# Patient Record
Sex: Female | Born: 1935 | Race: Black or African American | Hispanic: No | Smoking: Never smoker
Health system: Southern US, Community
[De-identification: ages and names within clinical notes are randomized; demographics above are authoritative.]

## PROBLEM LIST (undated history)

## (undated) DIAGNOSIS — F419 Anxiety disorder, unspecified: Secondary | ICD-10-CM

## (undated) DIAGNOSIS — F0280 Dementia in other diseases classified elsewhere without behavioral disturbance: Secondary | ICD-10-CM

## (undated) DIAGNOSIS — G309 Alzheimer's disease, unspecified: Secondary | ICD-10-CM

## (undated) HISTORY — PX: ABDOMINAL HYSTERECTOMY: SHX81

---

## 2017-07-15 ENCOUNTER — Other Ambulatory Visit: Payer: Self-pay

## 2017-07-15 ENCOUNTER — Emergency Department: Payer: Medicare Other

## 2017-07-15 ENCOUNTER — Encounter: Payer: Self-pay | Admitting: Emergency Medicine

## 2017-07-15 ENCOUNTER — Inpatient Hospital Stay
Admission: EM | Admit: 2017-07-15 | Discharge: 2017-07-21 | DRG: 871 | Disposition: A | Discharge status: Home Health | Payer: Medicare Other | Attending: Internal Medicine | Admitting: Internal Medicine

## 2017-07-15 DIAGNOSIS — A419 Sepsis, unspecified organism: Principal | ICD-10-CM | POA: Diagnosis present

## 2017-07-15 DIAGNOSIS — N390 Urinary tract infection, site not specified: Secondary | ICD-10-CM | POA: Diagnosis not present

## 2017-07-15 DIAGNOSIS — B9620 Unspecified Escherichia coli [E. coli] as the cause of diseases classified elsewhere: Secondary | ICD-10-CM | POA: Diagnosis present

## 2017-07-15 DIAGNOSIS — F0390 Unspecified dementia without behavioral disturbance: Secondary | ICD-10-CM | POA: Diagnosis not present

## 2017-07-15 DIAGNOSIS — Z79899 Other long term (current) drug therapy: Secondary | ICD-10-CM

## 2017-07-15 DIAGNOSIS — R54 Age-related physical debility: Secondary | ICD-10-CM | POA: Diagnosis present

## 2017-07-15 DIAGNOSIS — Z6822 Body mass index (BMI) 22.0-22.9, adult: Secondary | ICD-10-CM | POA: Diagnosis not present

## 2017-07-15 DIAGNOSIS — R680 Hypothermia, not associated with low environmental temperature: Secondary | ICD-10-CM | POA: Diagnosis present

## 2017-07-15 DIAGNOSIS — I959 Hypotension, unspecified: Secondary | ICD-10-CM | POA: Diagnosis present

## 2017-07-15 DIAGNOSIS — R410 Disorientation, unspecified: Secondary | ICD-10-CM | POA: Diagnosis not present

## 2017-07-15 DIAGNOSIS — G9349 Other encephalopathy: Secondary | ICD-10-CM | POA: Diagnosis present

## 2017-07-15 DIAGNOSIS — R569 Unspecified convulsions: Secondary | ICD-10-CM | POA: Diagnosis present

## 2017-07-15 DIAGNOSIS — Z7189 Other specified counseling: Secondary | ICD-10-CM

## 2017-07-15 DIAGNOSIS — Z66 Do not resuscitate: Secondary | ICD-10-CM | POA: Diagnosis present

## 2017-07-15 DIAGNOSIS — Z515 Encounter for palliative care: Secondary | ICD-10-CM | POA: Diagnosis present

## 2017-07-15 DIAGNOSIS — E876 Hypokalemia: Secondary | ICD-10-CM | POA: Diagnosis not present

## 2017-07-15 DIAGNOSIS — D61818 Other pancytopenia: Secondary | ICD-10-CM | POA: Diagnosis not present

## 2017-07-15 DIAGNOSIS — R591 Generalized enlarged lymph nodes: Secondary | ICD-10-CM | POA: Diagnosis present

## 2017-07-15 DIAGNOSIS — E43 Unspecified severe protein-calorie malnutrition: Secondary | ICD-10-CM | POA: Diagnosis present

## 2017-07-15 DIAGNOSIS — N3001 Acute cystitis with hematuria: Secondary | ICD-10-CM | POA: Diagnosis present

## 2017-07-15 DIAGNOSIS — G309 Alzheimer's disease, unspecified: Secondary | ICD-10-CM | POA: Diagnosis present

## 2017-07-15 DIAGNOSIS — R4182 Altered mental status, unspecified: Secondary | ICD-10-CM | POA: Diagnosis not present

## 2017-07-15 DIAGNOSIS — F0280 Dementia in other diseases classified elsewhere without behavioral disturbance: Secondary | ICD-10-CM | POA: Diagnosis present

## 2017-07-15 HISTORY — DX: Dementia in other diseases classified elsewhere, unspecified severity, without behavioral disturbance, psychotic disturbance, mood disturbance, and anxiety: F02.80

## 2017-07-15 HISTORY — DX: Anxiety disorder, unspecified: F41.9

## 2017-07-15 HISTORY — DX: Alzheimer's disease, unspecified: G30.9

## 2017-07-15 LAB — URINALYSIS, COMPLETE (UACMP) WITH MICROSCOPIC
BILIRUBIN URINE: NEGATIVE
GLUCOSE, UA: NEGATIVE mg/dL
KETONES UR: NEGATIVE mg/dL
Nitrite: NEGATIVE
PROTEIN: 30 mg/dL — AB
Specific Gravity, Urine: 1.017 (ref 1.005–1.030)
pH: 5 (ref 5.0–8.0)

## 2017-07-15 LAB — CBC WITH DIFFERENTIAL/PLATELET
BASOS ABS: 0 10*3/uL (ref 0–0.1)
Basophils Relative: 0 %
EOS ABS: 0 10*3/uL (ref 0–0.7)
EOS PCT: 0 %
HCT: 35.7 % (ref 35.0–47.0)
Hemoglobin: 11.8 g/dL — ABNORMAL LOW (ref 12.0–16.0)
LYMPHS ABS: 0.4 10*3/uL — AB (ref 1.0–3.6)
Lymphocytes Relative: 12 %
MCH: 29.6 pg (ref 26.0–34.0)
MCHC: 33.1 g/dL (ref 32.0–36.0)
MCV: 89.4 fL (ref 80.0–100.0)
Monocytes Absolute: 0.2 10*3/uL (ref 0.2–0.9)
Monocytes Relative: 6 %
Neutro Abs: 2.7 10*3/uL (ref 1.4–6.5)
Neutrophils Relative %: 82 %
PLATELETS: 118 10*3/uL — AB (ref 150–440)
RBC: 4 MIL/uL (ref 3.80–5.20)
RDW: 16.9 % — ABNORMAL HIGH (ref 11.5–14.5)
WBC: 3.3 10*3/uL — AB (ref 3.6–11.0)

## 2017-07-15 LAB — COMPREHENSIVE METABOLIC PANEL
ALK PHOS: 58 U/L (ref 38–126)
ALT: 19 U/L (ref 14–54)
ANION GAP: 10 (ref 5–15)
AST: 34 U/L (ref 15–41)
Albumin: 2.9 g/dL — ABNORMAL LOW (ref 3.5–5.0)
BILIRUBIN TOTAL: 0.5 mg/dL (ref 0.3–1.2)
BUN: 39 mg/dL — ABNORMAL HIGH (ref 6–20)
CALCIUM: 9 mg/dL (ref 8.9–10.3)
CO2: 29 mmol/L (ref 22–32)
Chloride: 100 mmol/L — ABNORMAL LOW (ref 101–111)
Creatinine, Ser: 1.04 mg/dL — ABNORMAL HIGH (ref 0.44–1.00)
GFR calc non Af Amer: 46 mL/min — ABNORMAL LOW (ref >60)
GFR, EST AFRICAN AMERICAN: 53 mL/min — AB (ref >60)
Glucose, Bld: 121 mg/dL — ABNORMAL HIGH (ref 65–99)
Potassium: 3.7 mmol/L (ref 3.5–5.1)
Sodium: 139 mmol/L (ref 135–145)
TOTAL PROTEIN: 5.9 g/dL — AB (ref 6.5–8.1)

## 2017-07-15 LAB — LIPASE, BLOOD: Lipase: 16 U/L (ref 11–51)

## 2017-07-15 LAB — PROTIME-INR
INR: 1.15
Prothrombin Time: 14.6 seconds (ref 11.4–15.2)

## 2017-07-15 LAB — APTT: aPTT: 32 seconds (ref 24–36)

## 2017-07-15 LAB — PROCALCITONIN

## 2017-07-15 LAB — TROPONIN I

## 2017-07-15 LAB — LACTIC ACID, PLASMA
Lactic Acid, Venous: 2.5 mmol/L (ref 0.5–1.9)
Lactic Acid, Venous: 1.2 mmol/L (ref 0.5–1.9)

## 2017-07-15 MED ORDER — PIPERACILLIN-TAZOBACTAM 3.375 G IVPB
3.3750 g | Freq: Three times a day (TID) | INTRAVENOUS | Status: DC
  Administered 2017-07-16 – 2017-07-18 (×6): 3.375 g via INTRAVENOUS
  Filled 2017-07-15 (×6): qty 50

## 2017-07-15 MED ORDER — SODIUM CHLORIDE 0.9 % IV BOLUS (SEPSIS)
1000.0000 mL | Freq: Once | INTRAVENOUS | Status: AC
  Administered 2017-07-15: 1000 mL via INTRAVENOUS

## 2017-07-15 MED ORDER — VANCOMYCIN HCL IN DEXTROSE 750-5 MG/150ML-% IV SOLN
750.0000 mg | INTRAVENOUS | Status: DC
  Administered 2017-07-17: 750 mg via INTRAVENOUS
  Filled 2017-07-15: qty 150

## 2017-07-15 MED ORDER — PIPERACILLIN-TAZOBACTAM 3.375 G IVPB 30 MIN
3.3750 g | Freq: Once | INTRAVENOUS | Status: AC
  Administered 2017-07-15: 3.375 g via INTRAVENOUS
  Filled 2017-07-15: qty 50

## 2017-07-15 MED ORDER — VANCOMYCIN HCL IN DEXTROSE 1-5 GM/200ML-% IV SOLN
1000.0000 mg | Freq: Once | INTRAVENOUS | Status: AC
  Administered 2017-07-15: 1000 mg via INTRAVENOUS
  Filled 2017-07-15: qty 200

## 2017-07-15 MED ORDER — SODIUM CHLORIDE 0.9 % IV BOLUS (SEPSIS)
500.0000 mL | Freq: Once | INTRAVENOUS | Status: AC
  Administered 2017-07-15: 500 mL via INTRAVENOUS

## 2017-07-15 NOTE — H&P (Signed)
[facility] -  at [facility]   PATIENT NAME: Hayley Greene    MR#:  [id number]  DATE OF BIRTH:  04-Aug-1925  DATE OF ADMISSION:  07/15/2017  PRIMARY CARE PHYSICIAN: [facility]   REQUESTING/REFERRING PHYSICIAN: Scotty CourtStafford, MD  CHIEF COMPLAINT:   Chief Complaint  Patient presents with  . Altered Mental Status    HISTORY OF PRESENT ILLNESS:  Hayley LowersLuvenia Greene  is a 83 y.o. female who presents with increased confusion, hypothermia, possible seizure event.  Patient's granddaughter who is one of her caregivers states that tonight she began to have shaking episode, she was shaking all over and her eyes rolled back in her head, and she also had loss of bladder continence.  It is hard to say if the patient is postictal or not as she presents today septic from a urine infection.  Patient also has dementia and often times will have a 2-3-day period where she is more lethargic per the family's report.  Hospitals were initially called for admission, and after speaking with the family they [facility]  However, [facility] refused [facility], so hospitalist were asked to admit.  PAST MEDICAL HISTORY:   Past Medical History:  Diagnosis Date  . Alzheimer disease   . Anxiety     PAST SURGICAL HISTORY:   Past Surgical History:  Procedure Laterality Date  . ABDOMINAL HYSTERECTOMY      SOCIAL HISTORY:   Social History   Tobacco Use  . Smoking status: Never Smoker  Substance Use Topics  . Alcohol use: No    Frequency: Never    FAMILY HISTORY:   Family History  Problem Relation Age of Onset  . Stroke Brother     DRUG ALLERGIES:  No Known Allergies  MEDICATIONS AT HOME:   Prior to Admission medications   Medication Sig Start Date End Date Taking? Authorizing Provider  risperiDONE (RISPERDAL) 0.5 MG tablet Take 1 mg by mouth daily.   Yes [provider]    REVIEW OF SYSTEMS:  Review of Systems   Unable to perform ROS: Acuity of condition     VITAL SIGNS:   Vitals:   07/15/17 2230 07/15/17 2300 07/15/17 2327 07/15/17 2330  BP: (!) 98/53 (!) 96/51  (!) 92/49  Pulse: (!) 59 69  70  Resp: 16 16  15   Temp:   (!) 91 F (32.8 C)   TempSrc:      SpO2: 97% 95%  96%  Weight:      Height:       Wt Readings from Last 3 Encounters:  07/15/17 49.9 kg (110 lb)    PHYSICAL EXAMINATION:  Physical Exam  Vitals reviewed. Constitutional: She appears well-developed and well-nourished. No distress.  HENT:  Head: Normocephalic and atraumatic.  Mouth/Throat: Oropharynx is clear and moist.  Eyes: Conjunctivae and EOM are normal. Pupils are equal, round, and reactive to light. No scleral icterus.  Neck: Normal range of motion. Neck supple. No JVD present. No thyromegaly present.  Cardiovascular: Normal rate, regular rhythm and intact distal pulses. Exam reveals no gallop and no friction rub.  No murmur heard. Respiratory: Effort normal and breath sounds normal. No respiratory distress. She has no wheezes. She has no rales.  GI: Soft. Bowel sounds are normal. She exhibits no distension. There is no tenderness.  Musculoskeletal: Normal range of motion. She exhibits no edema.  No arthritis, no gout  Lymphadenopathy:    She has no  cervical adenopathy.  Neurological:  Unable to assess due to patient condition  Skin: Skin is warm and dry. No rash noted. No erythema.  Psychiatric:  Unable to assess due to patient condition    LABORATORY PANEL:   CBC Recent Labs  Lab 07/15/17 1948  WBC 3.3*  HGB 11.8*  HCT 35.7  PLT 118*   ------------------------------------------------------------------------------------------------------------------  Chemistries  Recent Labs  Lab 07/15/17 1948  NA 139  K 3.7  CL 100*  CO2 29  GLUCOSE 121*  BUN 39*  CREATININE 1.04*  CALCIUM 9.0  AST 34  ALT 19  ALKPHOS 58  BILITOT 0.5    ------------------------------------------------------------------------------------------------------------------  Cardiac Enzymes Recent Labs  Lab 07/15/17 1948  TROPONINI <0.03   ------------------------------------------------------------------------------------------------------------------  RADIOLOGY:  Ct Head Wo Contrast  Result Date: 07/15/2017 CLINICAL DATA:  83 year old female with altered mental status. EXAM: CT HEAD WITHOUT CONTRAST TECHNIQUE: Contiguous axial images were obtained from the base of the skull through the vertex without intravenous contrast. COMPARISON:  None. FINDINGS: Brain: No evidence of acute infarction, hemorrhage, hydrocephalus, extra-axial collection or mass lesion/mass effect. Moderate cerebral atrophy and chronic small-vessel white matter ischemic changes noted. Vascular: Atherosclerotic calcifications noted. Skull: Normal. Negative for fracture or focal lesion. Sinuses/Orbits: No acute finding. Other: None. IMPRESSION: 1. No evidence of acute intracranial abnormality 2. Atrophy and chronic small-vessel white matter ischemic changes. Electronically Signed   By: Harmon PierJeffrey  Hu M.D.   On: 07/15/2017 21:15   Dg Chest Port 1 View  Result Date: 07/15/2017 CLINICAL DATA:  Altered mental status and weakness today. EXAM: PORTABLE CHEST 1 VIEW COMPARISON:  None. FINDINGS: The cardiomediastinal silhouette is unremarkable. Mild left basilar atelectasis with possible trace left pleural effusion noted. There is no evidence of pneumothorax, airspace disease, edema or pulmonary mass. No acute bony abnormalities are identified. IMPRESSION: Mild left basilar atelectasis was possible trace left pleural effusion. Electronically Signed   By: Harmon PierJeffrey  Hu M.D.   On: 07/15/2017 21:13    EKG:   Orders placed or performed during the hospital encounter of 07/15/17  . EKG 12-Lead  . EKG 12-Lead  . EKG 12-Lead  . EKG 12-Lead    IMPRESSION AND PLAN:  Principal Problem:   Sepsis  (HCC) -IV antibiotics, lactic acid was initially elevated but corrected with fluids, blood pressure is borderline normotensive to low, continue IV fluids for blood pressure support, cultures sent Active Problems:   UTI (urinary tract infection) -because of sepsis as above, IV antibiotics, follow culture results   Dementia -continue home meds once patient is awake enough to take p.o. again  All the records are reviewed and case discussed with ED provider. Management plans discussed with the patient and/or family.  DVT PROPHYLAXIS: SubQ lovenox  GI PROPHYLAXIS: None  ADMISSION STATUS: Inpatient  CODE STATUS: Full, patient was unable to make this decision, this will need to be clarified with family. Code Status History    This patient does not have a recorded code status. Please follow your organizational policy for patients in this situation.      TOTAL TIME TAKING CARE OF THIS PATIENT: 45 minutes.   Mahala Rommel FIELDING 07/15/2017, 11:50 PM  [facility]  Office  [phone]  CC: Primary care physician; [facility]  Note:  This document was prepared using Conservation officer, historic buildingsDragon voice recognition software and may include unintentional dictation errors.

## 2017-07-15 NOTE — Progress Notes (Signed)
ANTIBIOTIC CONSULT NOTE - INITIAL  Pharmacy Consult for Zosyn and vancomycin Indication: sepsis  Allergies not on file  Patient Measurements: Height: 5' (152.4 cm) Weight: 110 lb (49.9 kg) IBW/kg (Calculated) : 45.5 Adjusted Body Weight:   Vital Signs: Temp: 88.6 F (31.4 C) (12/02 1959) Temp Source: Rectal (12/02 1959) BP: 109/66 (12/02 1959) Pulse Rate: 64 (12/02 1959) Intake/Output from previous day: No intake/output data recorded. Intake/Output from this shift: No intake/output data recorded.  Labs: Recent Labs    07/15/17 1948  WBC 3.3*  HGB 11.8*  PLT 118*  CREATININE 1.04*   Estimated Creatinine Clearance: 25.8 mL/min (A) (by C-G formula based on SCr of 1.04 mg/dL (H)). No results for input(s): VANCOTROUGH, VANCOPEAK, VANCORANDOM, GENTTROUGH, GENTPEAK, GENTRANDOM, TOBRATROUGH, TOBRAPEAK, TOBRARND, AMIKACINPEAK, AMIKACINTROU, AMIKACIN in the last 72 hours.   Microbiology: No results found for this or any previous visit (from the past 720 hour(s)).  Medical History: No past medical history on file.  Medications:  Infusions:  . piperacillin-tazobactam 3.375 g (07/15/17 2108)  . [START ON 07/16/2017] piperacillin-tazobactam (ZOSYN)  IV    . sodium chloride 1,000 mL (07/15/17 2108)   And  . sodium chloride    . vancomycin 1,000 mg (07/15/17 2109)  . [START ON 07/17/2017] vancomycin     Assessment: 89 yof cc AMS, not speaking or eating x 2 days per family. Hypothermic, hypotensive, sepsis protocol initiated. Pharmacy consulted to dose Zosyn and vancomycin for sepsis.   Goal of Therapy:  Vancomycin trough level 15-20 mcg/ml  Resolve infection Prevent ADE  Plan:  1. Zosyn 3.375 gm IV Q8H EI 2. Vancomycin 1 gm IV x 1 followed in approximately 30 hours (stacked dosing) by vancomycin 750 mg IV Q36H, predicted trough 16 mcg/mL. Pharmacy will continue to follow and adjust as needed to maintain trough 15 to 20 mcg/mL.   Vd 31.5 L, Ke 0.026 hr-1, T1/2 27.1  hr  Carola FrostNathan A Laylaa Guevarra, Pharm.D., BCPS Clinical Pharmacist 07/15/2017,9:22 PM

## 2017-07-15 NOTE — ED Notes (Addendum)
Patient returned to room, IVF and antibiotics started.  Patient placed back on warmer.  Family at bedside.

## 2017-07-15 NOTE — ED Notes (Signed)
Patient cool to touch, rectal tem 88.6, MD notified.  Warm blankets placed on patient until warmer can be placed.

## 2017-07-15 NOTE — ED Notes (Signed)
Patient to CT via stretcher.

## 2017-07-15 NOTE — ED Triage Notes (Signed)
Patient to ED via EMS from home of her granddaughter with whom she lives with only two days a week. [place] arrived from [place]other grand[place] on Tuesday and on Thursday became altered, not speaking or eating, and only a few sips of water. Patient is nonverbal on arrival to ED with a core temperature of 88.6 rectally,

## 2017-07-15 NOTE — Progress Notes (Signed)
CODE SEPSIS - PHARMACY COMMUNICATION  **Broad Spectrum Antibiotics should be administered within 1 hour of Sepsis diagnosis**  Time Code Sepsis Called/Page Received: 20:06  Antibiotics Ordered: Zosyn and vancomycin  Time of 1st antibiotic administration: 21:08  Additional action taken by pharmacy:   If necessary, Name of Provider/Nurse Contacted: Spoke with multiple RN, phone not with patient's nurse. Gave my call back number in rx [phone].  20:42 [facility] - she asks about compatibility (may be run together) - and says she'll take them out now.   21:00 spoke with Dawn again - 6 minutes left to administer antibiotic. She is aware and has abx hanging already, as soon as patient gets back from CT she just has to attach it.   Carola FrostNathan A Serenitie Vinton ,Pharm.D., BCPS Clinical Pharmacist  07/15/2017  8:20 PM

## 2017-07-15 NOTE — ED Notes (Signed)
Patient to [facility] via EMS from granddaughters home.  Per EMS they were called due to patient being unresponsive for family.  EMS report that family told them that she spends few days with granddaughter her in [place] and several days with granddaughter in [place].  EMS reports that patient was only responsive to painful stimuli.

## 2017-07-15 NOTE — ED Provider Notes (Signed)
[facility] Emergency Department Provider Note  ____________________________________________  Time seen: Approximately 8:22 PM  I have reviewed the triage vital signs and the nursing notes.   HISTORY  Chief Complaint Altered Mental Status  Level 5 Caveat: Portions of the History and Physical were unable to be obtained due to altered mental status due to chronic dementia and acute illness.   HPI Edana Aguado is a 83 y.o. female brought to the ED by EMS due to decreased level of consciousness, not speaking or eating for the past 2 days. No other history is available at this time. The patient lives with her 2 granddaughters, splitting time between their 2 residences. Today her granddaughter was concerned about the patient's health and called EMS.   Past Medical History:  Diagnosis Date  . Alzheimer disease    Dementia  There are no active problems to display for this patient.       Prior to Admission medications   Medication Sig Start Date End Date Taking? Authorizing Provider  risperiDONE (RISPERDAL) 0.5 MG tablet Take 1 mg by mouth daily.   Yes [provider]     Allergies Patient has no known allergies.   No family history on file.  Social History Social History   Tobacco Use  . Smoking status: Not on file  Substance Use Topics  . Alcohol use: Not on file  . Drug use: Not on file  The tobacco or alcohol use  Review of Systems Not obtainable due to altered mental status  ____________________________________________   PHYSICAL EXAM:  VITAL SIGNS: ED Triage Vitals  Enc Vitals Group     BP 07/15/17 1959 109/66     Pulse Rate 07/15/17 1959 64     Resp 07/15/17 1959 12     Temp 07/15/17 1959 (!) 88.6 F (31.4 C)     Temp Source 07/15/17 1959 Rectal     SpO2 07/15/17 1959 96 %     Weight 07/15/17 2001 110 lb (49.9 kg)     Height 07/15/17 2001 5' (1.524 m)     Head Circumference --      Peak Flow --      Pain Score  --      Pain Loc --      Pain Edu? --      Excl. in [place]? --     Vital signs reviewed, nursing assessments reviewed.   Constitutional:   Awake, not alert or oriented. Not in distress. Eyes:   No scleral icterus.  EOMI. No nystagmus. No conjunctival pallor. PERRL. ENT   Head:   Normocephalic and atraumatic.   Nose:   No congestion/rhinnorhea.    Mouth/Throat:   Dry mucous membranes, no pharyngeal erythema. No peritonsillar mass.    Neck:   No meningismus. Full ROM. Hematological/Lymphatic/Immunilogical:   No cervical lymphadenopathy. Cardiovascular:   RRR. Symmetric bilateral radial and DP pulses.  No murmurs.  Respiratory:   Normal respiratory effort without tachypnea/retractions. Breath sounds are clear and equal bilaterally. No wheezes/rales/rhonchi. Gastrointestinal:   Soft and nontender. Non distended. There is no CVA tenderness.  No rebound, rigidity, or guarding. Genitourinary:   deferred Musculoskeletal:   Normal range of motion in all extremities. No joint effusions.  No lower extremity tenderness.  No edema. Neurologic:   Nonverbal Unable to follow commands  Motor grossly intact..  Skin:    Skin is warm, dry and intact. No rash noted.  No petechiae, purpura, or bullae.  ____________________________________________    LABS (  pertinent positives/negatives) (all labs ordered are listed, but only abnormal results are displayed) Labs Reviewed  LACTIC ACID, PLASMA - Abnormal; Notable for the following components:      Result Value   Lactic Acid, Venous 2.5 (*)    All other components within normal limits  COMPREHENSIVE METABOLIC PANEL - Abnormal; Notable for the following components:   Chloride 100 (*)    Glucose, Bld 121 (*)    BUN 39 (*)    Creatinine, Ser 1.04 (*)    Total Protein 5.9 (*)    Albumin 2.9 (*)    GFR calc non Af Amer 46 (*)    GFR calc Af Amer 53 (*)    All other components within normal limits  CBC WITH DIFFERENTIAL/PLATELET - Abnormal;  Notable for the following components:   WBC 3.3 (*)    Hemoglobin 11.8 (*)    RDW 16.9 (*)    Platelets 118 (*)    Lymphs Abs 0.4 (*)    All other components within normal limits  URINALYSIS, COMPLETE (UACMP) WITH MICROSCOPIC - Abnormal; Notable for the following components:   Color, Urine AMBER (*)    APPearance CLOUDY (*)    Hgb urine dipstick SMALL (*)    Protein, ur 30 (*)    Leukocytes, UA LARGE (*)    Bacteria, UA MANY (*)    Squamous Epithelial / LPF 0-5 (*)    All other components within normal limits  CULTURE, BLOOD (ROUTINE X 2)  CULTURE, BLOOD (ROUTINE X 2)  URINE CULTURE  LACTIC ACID, PLASMA  LIPASE, BLOOD  TROPONIN I  PROCALCITONIN  APTT  PROTIME-INR   ____________________________________________   EKG  Interpreted by me Sinus rhythm rate of 62, normal axis and intervals. Normal QRS ST segments and T waves.  ____________________________________________    RADIOLOGY  Ct Head Wo Contrast  Result Date: 07/15/2017 CLINICAL DATA:  83 year old female with altered mental status. EXAM: CT HEAD WITHOUT CONTRAST TECHNIQUE: Contiguous axial images were obtained from the base of the skull through the vertex without intravenous contrast. COMPARISON:  None. FINDINGS: Brain: No evidence of acute infarction, hemorrhage, hydrocephalus, extra-axial collection or mass lesion/mass effect. Moderate cerebral atrophy and chronic small-vessel white matter ischemic changes noted. Vascular: Atherosclerotic calcifications noted. Skull: Normal. Negative for fracture or focal lesion. Sinuses/Orbits: No acute finding. Other: None. IMPRESSION: 1. No evidence of acute intracranial abnormality 2. Atrophy and chronic small-vessel white matter ischemic changes. Electronically Signed   By: Margarette Canada M.D.   On: 07/15/2017 21:15   Dg Chest Port 1 View  Result Date: 07/15/2017 CLINICAL DATA:  Altered mental status and weakness today. EXAM: PORTABLE CHEST 1 VIEW COMPARISON:  None. FINDINGS: The  cardiomediastinal silhouette is unremarkable. Mild left basilar atelectasis with possible trace left pleural effusion noted. There is no evidence of pneumothorax, airspace disease, edema or pulmonary mass. No acute bony abnormalities are identified. IMPRESSION: Mild left basilar atelectasis was possible trace left pleural effusion. Electronically Signed   By: Margarette Canada M.D.   On: 07/15/2017 21:13    ____________________________________________   PROCEDURES Procedures   Peripheral IV insertion by physician Indication: Multiple failed attempts by nursing staff, need for IV access and/or blood samples for workup Performed under continuous real-time ultrasound visualization Area cleaned with chlorhexidine. 20-gauge IV successfully placed in the left antecubital fossa. 2 attempts, no complications, EBL 0.   CRITICAL CARE Performed by: Joni Fears, Micheline Markes   Total critical care time: 35 minutes  Critical care time was exclusive of separately  billable procedures and treating other patients.  Critical care was necessary to treat or prevent imminent or life-threatening deterioration.  Critical care was time spent personally by me on the following activities: development of treatment plan with patient and/or surrogate as well as nursing, discussions with consultants, evaluation of patient's response to treatment, examination of patient, obtaining history from patient or surrogate, ordering and performing treatments and interventions, ordering and review of laboratory studies, ordering and review of radiographic studies, pulse oximetry and re-evaluation of patient's condition.  ____________________________________________   DIFFERENTIAL DIAGNOSIS  Stroke, intracranial hemorrhage, sepsis, dehydration  CLINICAL IMPRESSION / ASSESSMENT AND PLAN / ED COURSE  Pertinent labs & imaging results that were available during my care of the patient were reviewed by me and considered in my medical  decision making (see chart for details).   Patient presents with altered mental status, nonverbal and not following commands. Significantly hypothermic, raising concern for sepsis. Sepsis protocol initiated although there was a delay in actually activating the code sepsis protocol through care Link due to extended time being spent at the bedside upon initial assessment to rapidly obtain IV access to facilitate patient care.  I been notified that the family has arrived in the waiting room and are upset that they have not been able to come see the patient immediately while we clean up incontinent stool and obtain IV access and blood samples. We will bring them to the bedside as soon as feasible, once the patient's hygiene needs have been met and medical work up is in process.  Clinical Course as of Jul 15 2304  Nancy Fetter Jul 15, 2017  2203 CXR and CT head neg for pna, ptx, or ICH. Lactate elevated, continue IVF. I did discuss pt's sx and plan with family who were in agreement at bedside. Family do report that pt has had episodes of dehydration in the past and had to come to hospital for IVF, but hypothermia is a new issue.   [PS]  2253 Labs reveal UTI. Repeat lactate normalized. Will admit for further management of sepsis due to UTI.   [PS]    Clinical Course User Index [PS] Carrie Mew, MD     ____________________________________________   FINAL CLINICAL IMPRESSION(S) / ED DIAGNOSES    Final diagnoses:  Sepsis, due to unspecified organism [facility])  Lower urinary tract infectious disease  Confusion  Dementia without behavioral disturbance, unspecified dementia type      This SmartLink is deprecated. Use AVSMEDLIST instead to display the medication list for a patient.   Portions of this note were generated with dragon dictation software. Dictation errors may occur despite best attempts at proofreading.    Carrie Mew, MD 07/15/17 2306

## 2017-07-16 ENCOUNTER — Other Ambulatory Visit: Payer: Self-pay

## 2017-07-16 ENCOUNTER — Inpatient Hospital Stay (HOSPITAL_COMMUNITY): Payer: Medicare Other

## 2017-07-16 ENCOUNTER — Inpatient Hospital Stay: Payer: Medicare Other

## 2017-07-16 DIAGNOSIS — R4182 Altered mental status, unspecified: Secondary | ICD-10-CM

## 2017-07-16 DIAGNOSIS — R569 Unspecified convulsions: Secondary | ICD-10-CM

## 2017-07-16 LAB — CBC
HCT: 29.9 % — ABNORMAL LOW (ref 35.0–47.0)
Hemoglobin: 9.9 g/dL — ABNORMAL LOW (ref 12.0–16.0)
MCH: 29.7 pg (ref 26.0–34.0)
MCHC: 33.3 g/dL (ref 32.0–36.0)
MCV: 89.3 fL (ref 80.0–100.0)
PLATELETS: 110 10*3/uL — AB (ref 150–440)
RBC: 3.34 MIL/uL — AB (ref 3.80–5.20)
RDW: 16.7 % — AB (ref 11.5–14.5)
WBC: 4.9 10*3/uL (ref 3.6–11.0)

## 2017-07-16 LAB — BLOOD CULTURE ID PANEL (REFLEXED)
Acinetobacter baumannii: NOT DETECTED
CANDIDA KRUSEI: NOT DETECTED
CANDIDA PARAPSILOSIS: NOT DETECTED
CANDIDA TROPICALIS: NOT DETECTED
Candida albicans: NOT DETECTED
Candida glabrata: NOT DETECTED
ESCHERICHIA COLI: NOT DETECTED
Enterobacter cloacae complex: NOT DETECTED
Enterobacteriaceae species: NOT DETECTED
Enterococcus species: NOT DETECTED
Haemophilus influenzae: NOT DETECTED
KLEBSIELLA OXYTOCA: NOT DETECTED
KLEBSIELLA PNEUMONIAE: NOT DETECTED
Listeria monocytogenes: NOT DETECTED
Neisseria meningitidis: NOT DETECTED
PROTEUS SPECIES: NOT DETECTED
Pseudomonas aeruginosa: NOT DETECTED
STAPHYLOCOCCUS AUREUS BCID: NOT DETECTED
STAPHYLOCOCCUS SPECIES: NOT DETECTED
Serratia marcescens: NOT DETECTED
Streptococcus agalactiae: NOT DETECTED
Streptococcus pneumoniae: NOT DETECTED
Streptococcus pyogenes: NOT DETECTED
Streptococcus species: NOT DETECTED

## 2017-07-16 LAB — BASIC METABOLIC PANEL
ANION GAP: 8 (ref 5–15)
BUN: 38 mg/dL — ABNORMAL HIGH (ref 6–20)
CO2: 24 mmol/L (ref 22–32)
Calcium: 7.9 mg/dL — ABNORMAL LOW (ref 8.9–10.3)
Chloride: 107 mmol/L (ref 101–111)
Creatinine, Ser: 1.13 mg/dL — ABNORMAL HIGH (ref 0.44–1.00)
GFR, EST AFRICAN AMERICAN: 48 mL/min — AB (ref >60)
GFR, EST NON AFRICAN AMERICAN: 41 mL/min — AB (ref >60)
Glucose, Bld: 79 mg/dL (ref 65–99)
POTASSIUM: 4 mmol/L (ref 3.5–5.1)
SODIUM: 139 mmol/L (ref 135–145)

## 2017-07-16 MED ORDER — HEPARIN SODIUM (PORCINE) 5000 UNIT/ML IJ SOLN
5000.0000 [IU] | Freq: Three times a day (TID) | INTRAMUSCULAR | Status: DC
  Administered 2017-07-16 – 2017-07-21 (×16): 5000 [IU] via SUBCUTANEOUS
  Filled 2017-07-16 (×16): qty 1

## 2017-07-16 MED ORDER — SODIUM CHLORIDE 0.9 % IV SOLN
INTRAVENOUS | Status: AC
  Administered 2017-07-16: 02:00:00 via INTRAVENOUS

## 2017-07-16 MED ORDER — ORAL CARE MOUTH RINSE
15.0000 mL | Freq: Two times a day (BID) | OROMUCOSAL | Status: DC
  Administered 2017-07-17 – 2017-07-20 (×6): 15 mL via OROMUCOSAL

## 2017-07-16 MED ORDER — ENOXAPARIN SODIUM 30 MG/0.3ML ~~LOC~~ SOLN: 30.0000 mg | SUBCUTANEOUS | Status: DC

## 2017-07-16 MED ORDER — ACETAMINOPHEN 325 MG PO TABS: 650.0000 mg | ORAL_TABLET | Freq: Four times a day (QID) | ORAL | Status: DC | PRN

## 2017-07-16 MED ORDER — RISPERIDONE 1 MG PO TABS
1.0000 mg | ORAL_TABLET | Freq: Every day | ORAL | Status: DC
  Administered 2017-07-19 – 2017-07-21 (×3): 1 mg via ORAL
  Filled 2017-07-16 (×6): qty 1

## 2017-07-16 MED ORDER — ONDANSETRON HCL 4 MG PO TABS: 4.0000 mg | ORAL_TABLET | Freq: Four times a day (QID) | ORAL | Status: DC | PRN

## 2017-07-16 MED ORDER — ENOXAPARIN SODIUM 40 MG/0.4ML ~~LOC~~ SOLN: 40.0000 mg | SUBCUTANEOUS | Status: DC

## 2017-07-16 MED ORDER — SODIUM CHLORIDE 0.9 % IV BOLUS (SEPSIS): 1000.0000 mL | Freq: Once | INTRAVENOUS | Status: DC

## 2017-07-16 MED ORDER — ONDANSETRON HCL 4 MG/2ML IJ SOLN: 4.0000 mg | Freq: Four times a day (QID) | INTRAMUSCULAR | Status: DC | PRN

## 2017-07-16 MED ORDER — ACETAMINOPHEN 650 MG RE SUPP: 650.0000 mg | Freq: Four times a day (QID) | RECTAL | Status: DC | PRN

## 2017-07-16 MED ORDER — ASPIRIN 300 MG RE SUPP
300.0000 mg | Freq: Every day | RECTAL | Status: DC
  Administered 2017-07-16 – 2017-07-18 (×3): 300 mg via RECTAL
  Filled 2017-07-16 (×3): qty 1

## 2017-07-16 MED ORDER — SODIUM CHLORIDE 0.9 % IV SOLN
INTRAVENOUS | Status: DC
  Administered 2017-07-16 – 2017-07-19 (×5): via INTRAVENOUS

## 2017-07-16 NOTE — Progress Notes (Signed)
PHARMACY - PHYSICIAN COMMUNICATION CRITICAL VALUE ALERT - BLOOD CULTURE IDENTIFICATION (BCID)  Results for orders placed or performed during the hospital encounter of 07/15/17  Blood Culture ID Panel (Reflexed) (Collected: 07/15/2017  8:39 PM)  Result Value Ref Range   Enterococcus species NOT DETECTED NOT DETECTED   Listeria monocytogenes NOT DETECTED NOT DETECTED   Staphylococcus species NOT DETECTED NOT DETECTED   Staphylococcus aureus NOT DETECTED NOT DETECTED   Streptococcus species NOT DETECTED NOT DETECTED   Streptococcus agalactiae NOT DETECTED NOT DETECTED   Streptococcus pneumoniae NOT DETECTED NOT DETECTED   Streptococcus pyogenes NOT DETECTED NOT DETECTED   Acinetobacter baumannii NOT DETECTED NOT DETECTED   Enterobacteriaceae species NOT DETECTED NOT DETECTED   Enterobacter cloacae complex NOT DETECTED NOT DETECTED   Escherichia coli NOT DETECTED NOT DETECTED   Klebsiella oxytoca NOT DETECTED NOT DETECTED   Klebsiella pneumoniae NOT DETECTED NOT DETECTED   Proteus species NOT DETECTED NOT DETECTED   Serratia marcescens NOT DETECTED NOT DETECTED   Haemophilus influenzae NOT DETECTED NOT DETECTED   Neisseria meningitidis NOT DETECTED NOT DETECTED   Pseudomonas aeruginosa NOT DETECTED NOT DETECTED   Candida albicans NOT DETECTED NOT DETECTED   Candida glabrata NOT DETECTED NOT DETECTED   Candida krusei NOT DETECTED NOT DETECTED   Candida parapsilosis NOT DETECTED NOT DETECTED   Candida tropicalis NOT DETECTED NOT DETECTED    Name of physician (or Provider) Contacted: Dr. Auburn BilberryShreyang Patel  Changes to prescribed antibiotics required: patient is on vancomycin and Zosyn with no ID per BCID- continue current for now  Valentina GuChristy, Imaad Reuss D 07/16/2017  8:26 PM

## 2017-07-16 NOTE — ED Notes (Signed)
Patient is a difficulty IV stick, MD attempted X 3 with ultrasound to obtain 1 IV site.

## 2017-07-16 NOTE — ED Provider Notes (Signed)
-----------------------------------------   12:27 AM on 07/16/2017 -----------------------------------------  Dr. Anne HahnWillis evaluated patient at bedside and informs me patient's family at bedside who now [facility] [place] and family lives nearby.  Her granddaughter who is at bedside also has POA and confirms that patient is a full code.  I have spoken with the [facility] [place] who will page out the physician.   ----------------------------------------- 12:51 AM on 07/16/2017 -----------------------------------------  Spoke with hospitalist Dr. Alfredo BachAvila who refused patient in transfer. States there is no medical necessity to transfer her. I did explain to him that the family is requesting transfer given the patient's PCP is located there and she has been hospitalized there before. Dr. Alfredo BachAvila did not accept. Will call back to Dr. Anne HahnWillis from hospitalist services for admission.   Irean HongSung, Rhett Mutschler J, MD 07/16/17 [phone]

## 2017-07-16 NOTE — Progress Notes (Signed)
Dr Sheryle Hailiamond made aware of lack of access.  Order given to consult IV team for access.

## 2017-07-16 NOTE — ED Notes (Signed)
Admitting physician in with patient and family

## 2017-07-16 NOTE — Progress Notes (Signed)
[employer] - [place] [facility]   PATIENT NAME: Hayley LowersLuvenia Greene    MR#:  [id number]  DATE OF BIRTH:  02/06/1925  SUBJECTIVE:   Patient here due to altered mental status and suspected seizure type activity. Patient's granddaughter is at bedside. Patient apparently had a seizure-like episode yesterday and she was incontinent of urine. She is on a post ictal and nonverbal. She responds minimally to painful stimuli. She was also hypothermic and noted to have urinary tract infection.  REVIEW OF SYSTEMS:    Review of Systems  Unable to perform ROS: Mental acuity    Nutrition: NPO Tolerating Diet: No Tolerating PT: Await Eval.   DRUG ALLERGIES:  No Known Allergies  VITALS:  Blood pressure (!) 94/44, pulse 82, temperature 97.6 F (36.4 C), temperature source Oral, resp. rate 19, height 5' (1.524 m), weight 51.1 kg (112 lb 11.2 oz), SpO2 95 %.  PHYSICAL EXAMINATION:   Physical Exam  GENERAL:  83 y.o.-year-old patient lying in bed encephalopathic but in NAD.  EYES: Pupils equal, round, reactive to light. No scleral icterus.  HEENT: Head atraumatic, normocephalic. Oropharynx and nasopharynx clear. Dry Oral mucosa.  NECK:  Supple, no jugular venous distention. No thyroid enlargement, no tenderness.  LUNGS: Normal breath sounds bilaterally, no wheezing, rales, rhonchi. No use of accessory muscles of respiration.  CARDIOVASCULAR: S1, S2 normal. No murmurs, rubs, or gallops.  ABDOMEN: Soft, nontender, nondistended. Bowel sounds present. No organomegaly or mass.  EXTREMITIES: No cyanosis, clubbing or edema b/l.    NEUROLOGIC: Difficult to assess a full neurological exam given the patient's encephalopathy the patient responds to painful stimuli. PSYCHIATRIC: The patient is alert and oriented x 1. Encephalopathic  SKIN: No obvious rash, lesion, or ulcer.    LABORATORY PANEL:   CBC Recent Labs  Lab 07/16/17 0605  WBC 4.9  HGB 9.9*  HCT 29.9*  PLT 110*    ------------------------------------------------------------------------------------------------------------------  Chemistries  Recent Labs  Lab 07/15/17 1948 07/16/17 0605  NA 139 139  K 3.7 4.0  CL 100* 107  CO2 29 24  GLUCOSE 121* 79  BUN 39* 38*  CREATININE 1.04* 1.13*  CALCIUM 9.0 7.9*  AST 34  --   ALT 19  --   ALKPHOS 58  --   BILITOT 0.5  --    ------------------------------------------------------------------------------------------------------------------  Cardiac Enzymes Recent Labs  Lab 07/15/17 1948  TROPONINI <0.03   ------------------------------------------------------------------------------------------------------------------  RADIOLOGY:  Ct Head Wo Contrast  Result Date: 07/15/2017 CLINICAL DATA:  83 year old female with altered mental status. EXAM: CT HEAD WITHOUT CONTRAST TECHNIQUE: Contiguous axial images were obtained from the base of the skull through the vertex without intravenous contrast. COMPARISON:  None. FINDINGS: Brain: No evidence of acute infarction, hemorrhage, hydrocephalus, extra-axial collection or mass lesion/mass effect. Moderate cerebral atrophy and chronic small-vessel white matter ischemic changes noted. Vascular: Atherosclerotic calcifications noted. Skull: Normal. Negative for fracture or focal lesion. Sinuses/Orbits: No acute finding. Other: None. IMPRESSION: 1. No evidence of acute intracranial abnormality 2. Atrophy and chronic small-vessel white matter ischemic changes. Electronically Signed   By: Harmon PierJeffrey  Hu M.D.   On: 07/15/2017 21:15   Dg Chest Port 1 View  Result Date: 07/15/2017 CLINICAL DATA:  Altered mental status and weakness today. EXAM: PORTABLE CHEST 1 VIEW COMPARISON:  None. FINDINGS: The cardiomediastinal silhouette is unremarkable. Mild left basilar atelectasis with possible trace left pleural effusion noted. There is no evidence of pneumothorax, airspace disease, edema or pulmonary mass. No acute bony  abnormalities are identified. IMPRESSION: Mild  left basilar atelectasis was possible trace left pleural effusion. Electronically Signed   By: Harmon PierJeffrey  Hu M.D.   On: 07/15/2017 21:13     ASSESSMENT AND PLAN:   83 year old female with advanced dementia was followed by hospice services at home presents to the hospital due to altered mental status and suspected to have a seizure.  1. Altered mental status-etiology unclear and suspected to be a multifactorial source. -Secondary to underlying dementia combined with suspected seizure with postictal state, and also urinary tract infection. -CT head on admission was negative for acute pathology. Continue IV antibiotics with Zosyn for urinary tract infection. Follow mental status. -Seen by neurology and plan for MRI of the brain today.  2. Suspected seizures-patient noted to have seizure type activity and then developed postictal state and was incontinent of urine as per the granddaughter yesterday. -Appreciate nephrology input no acute indication for antiepileptic treatment at this time. -Continue seizure precautions, await MRI of the brain.  3. Urinary tract infection-continue IV Zosyn, follow urine cultures.  4. Sepsis-patient with criteria given her hypothermia, hypotension also abnormal urinalysis. -Continue IV Zosyn, follow cultures as mentioned. Currently afebrile and hemodynamically stable.   Given patient's advanced dementia she probably has a very poor prognosis. Updated patient's granddaughters over the phone about the plan of care. They seem somewhat unrealistic. I will get a palliative care consult to discuss goals of care.  All the records are reviewed and case discussed with Care Management/Social Worker. Management plans discussed with the patient, family and they are in agreement.  CODE STATUS: Full code  DVT Prophylaxis: Lovenox  TOTAL TIME TAKING CARE OF THIS PATIENT: 35 minutes.   POSSIBLE D/C IN 2-3 DAYS, DEPENDING ON  CLINICAL CONDITION.   Houston SirenSAINANI,Woodrow Dulski J M.D on 07/16/2017 at 3:12 PM  Between 7am to 6pm - Pager - [phone]  After 6pm go to [email or web address] - Therapist, nutritionalpassword EPAS [facility]  [employer] [facility] [place] [facility]  Office  [phone]  CC: Primary care physician; [facility]

## 2017-07-16 NOTE — Consult Note (Signed)
Reason for Consult:Seizure like activity Referring Physician: Cherlynn KaiserSainani  CC: Seizure like activity  HPI: Hayley Greene is an 83 y.o. female with a history of dementia who is unable to provide ant history today.  Family not available.  Therefore all history obtained from the chart.  Patient was brought in by her granddaughter on yesterday.  It appears that the patient had an episode of shaking all over.  Her eyes rolled back in her head.  She also had urinary incontinence.  When she presented she was lethargic but it does appear that the patient has a history of having 2-3-day.  When she is more lethargic than other days.  It also seem to the patient had a urinary tract infection as well.  Patient has remained poorly responsive since presentation.  Consult called for further recommendations.  Past Medical History:  Diagnosis Date  . Alzheimer disease   . Anxiety     Past Surgical History:  Procedure Laterality Date  . ABDOMINAL HYSTERECTOMY      Family History  Problem Relation Age of Onset  . Stroke Brother     Social History:  reports that  has never smoked. she has never used smokeless tobacco. She reports that she does not drink alcohol or use drugs.  No Known Allergies  Medications:  I have reviewed the patient's current medications. Prior to Admission:  Medications Prior to Admission  Medication Sig Dispense Refill Last Dose  . risperiDONE (RISPERDAL) 0.5 MG tablet Take 1 mg by mouth daily.      Scheduled: . enoxaparin (LOVENOX) injection  30 mg Subcutaneous Q24H  . mouth rinse  15 mL Mouth Rinse BID  . risperiDONE  1 mg Oral Daily    ROS: Unable to provide due to mental status.  Physical Examination: Blood pressure (!) 86/41, pulse 92, temperature 97.7 F (36.5 C), temperature source Oral, resp. rate 19, height 5' (1.524 m), weight 51.1 kg (112 lb 11.2 oz), SpO2 96 %.  HEENT-  Normocephalic, no lesions, without obvious abnormality.  Normal external eye and  conjunctiva.  Normal TM's bilaterally.  Normal auditory canals and external ears. Normal external nose, mucus membranes and septum.  Normal pharynx. Cardiovascular- S1, S2 normal, pulses palpable throughout   Lungs- chest clear, no wheezing, rales, normal symmetric air entry Abdomen- soft, non-tender; bowel sounds normal; no masses,  no organomegaly Extremities- no edema Lymph-no adenopathy palpable Musculoskeletal-no joint tenderness, deformity or swelling Skin-warm and dry, no hyperpigmentation, vitiligo, or suspicious lesions  Neurological Examination   Mental Status: Patient does not respond to verbal stimuli.  Grimaces with deep sternal rub.  Does not follow commands.  No verbalizations noted.  Cranial Nerves: II: patient does not respond confrontation bilaterally, pupils right 3 mm, left 3 mm,and reactive bilaterally III,IV,VI: doll's response present bilaterally.  V,VII: corneal reflex present bilaterally  VIII: patient does not respond to verbal stimuli IX,X: gag reflex reduced, XI: trapezius strength unable to test bilaterally XII: tongue strength unable to test Motor: With sternal rub patient moves the left more than the right.  Localizes to pain with the LUE.   Sensory: Does not respond to noxious stimuli in any extremity. Deep Tendon Reflexes:  1+ in the upper extremities and absent in the lower extremities Plantars: mute bilaterally Cerebellar: Unable to perform    Laboratory Studies:   Basic Metabolic Panel: Recent Labs  Lab 07/15/17 1948 07/16/17 0605  NA 139 139  K 3.7 4.0  CL 100* 107  CO2 29 24  GLUCOSE  121* 79  BUN 39* 38*  CREATININE 1.04* 1.13*  CALCIUM 9.0 7.9*    Liver Function Tests: Recent Labs  Lab 07/15/17 1948  AST 34  ALT 19  ALKPHOS 58  BILITOT 0.5  PROT 5.9*  ALBUMIN 2.9*   Recent Labs  Lab 07/15/17 1948  LIPASE 16   No results for input(s): AMMONIA in the last 168 hours.  CBC: Recent Labs  Lab 07/15/17 1948  07/16/17 0605  WBC 3.3* 4.9  NEUTROABS 2.7  --   HGB 11.8* 9.9*  HCT 35.7 29.9*  MCV 89.4 89.3  PLT 118* 110*    Cardiac Enzymes: Recent Labs  Lab 07/15/17 1948  TROPONINI <0.03    BNP: Invalid input(s): POCBNP  CBG: No results for input(s): GLUCAP in the last 168 hours.  Microbiology: Results for orders placed or performed during the hospital encounter of 07/15/17  Blood Culture (routine x 2)     Status: None (Preliminary result)   Collection Time: 07/15/17  7:48 PM  Result Value Ref Range Status   Specimen Description BLOOD LEFT UPPER FOREARM  Final   Special Requests   Final    BOTTLES DRAWN AEROBIC AND ANAEROBIC Blood Culture results may not be optimal due to an excessive volume of blood received in culture bottles   Culture NO GROWTH < 12 HOURS  Final   Report Status PENDING  Incomplete  Blood Culture (routine x 2)     Status: None (Preliminary result)   Collection Time: 07/15/17  8:39 PM  Result Value Ref Range Status   Specimen Description BLOOD LEFT ANTECUBITAL  Final   Special Requests   Final    BOTTLES DRAWN AEROBIC AND ANAEROBIC Blood Culture results may not be optimal due to an excessive volume of blood received in culture bottles   Culture NO GROWTH < 12 HOURS  Final   Report Status PENDING  Incomplete    Coagulation Studies: Recent Labs    07/15/17 1948  LABPROT 14.6  INR 1.15    Urinalysis:  Recent Labs  Lab 07/15/17 2152  COLORURINE AMBER*  LABSPEC 1.017  PHURINE 5.0  GLUCOSEU NEGATIVE  HGBUR SMALL*  BILIRUBINUR NEGATIVE  KETONESUR NEGATIVE  PROTEINUR 30*  NITRITE NEGATIVE  LEUKOCYTESUR LARGE*    Lipid Panel:  No results found for: CHOL, TRIG, HDL, CHOLHDL, VLDL, LDLCALC  HgbA1C: No results found for: HGBA1C  Urine Drug Screen:  No results found for: LABOPIA, COCAINSCRNUR, LABBENZ, AMPHETMU, THCU, LABBARB  Alcohol Level: No results for input(s): ETH in the last 168 hours.  Other results: EKG: sinus rhythm at 62  bpm.  Imaging: Ct Head Wo Contrast  Result Date: 07/15/2017 CLINICAL DATA:  83 year old female with altered mental status. EXAM: CT HEAD WITHOUT CONTRAST TECHNIQUE: Contiguous axial images were obtained from the base of the skull through the vertex without intravenous contrast. COMPARISON:  None. FINDINGS: Brain: No evidence of acute infarction, hemorrhage, hydrocephalus, extra-axial collection or mass lesion/mass effect. Moderate cerebral atrophy and chronic small-vessel white matter ischemic changes noted. Vascular: Atherosclerotic calcifications noted. Skull: Normal. Negative for fracture or focal lesion. Sinuses/Orbits: No acute finding. Other: None. IMPRESSION: 1. No evidence of acute intracranial abnormality 2. Atrophy and chronic small-vessel white matter ischemic changes. Electronically Signed   By: Harmon PierJeffrey  Hu M.D.   On: 07/15/2017 21:15   Dg Chest Port 1 View  Result Date: 07/15/2017 CLINICAL DATA:  Altered mental status and weakness today. EXAM: PORTABLE CHEST 1 VIEW COMPARISON:  None. FINDINGS: The cardiomediastinal silhouette is unremarkable.  Mild left basilar atelectasis with possible trace left pleural effusion noted. There is no evidence of pneumothorax, airspace disease, edema or pulmonary mass. No acute bony abnormalities are identified. IMPRESSION: Mild left basilar atelectasis was possible trace left pleural effusion. Electronically Signed   By: Harmon Pier M.D.   On: 07/15/2017 21:13     Assessment/Plan: 83 year old female with a history of dementia presenting with seizure-like activity.  Patient with what appears to be a UTI presentation as well.  Started on antibiotics.  Head CT performed and reviewed.  It shows no acute changes.  Patient has not become more responsive since presentation.  There is also some question that the patient may have some focality on exam that is not her baseline.  At this point differential includes subclinical seizure activity, postictal state, and a  possible metabolic encephalopathy.  Unclear at this time full details of what patient's baseline functioning is.  It does appear that it may be better than what she is today.  Further workup recommended.  Recommendations: 1.  MRI of the brain without contrast 2.  EEG 3.  Seizure precautions 4.  Ativan as needed seizure activity 5.  Agree with treatment of infection.  At this time anticonvulsant therapy is not indicated. 6.  Aspirin 300 mg rectally daily.  Thana Farr, MD Neurology [phone] 07/16/2017, 10:54 AM

## 2017-07-16 NOTE — Progress Notes (Signed)
Initial Nutrition Assessment  DOCUMENTATION CODES:   Severe malnutrition in context of chronic illness  INTERVENTION:   Pt at refeeding risk; recommend monitor K, Mg, and P  Ensure Enlive po BID, each supplement provides 350 kcal and 20 grams of protein  Magic cup TID with meals, each supplement provides 290 kcal and 9 grams of protein  Vital Cuisine TID, each supplement provides 520kcal and 22g of protein.   NUTRITION DIAGNOSIS:   Severe Malnutrition related to chronic illness(dementia and advanced age ) as evidenced by severe fat depletion, severe muscle depletion.  GOAL:   Patient will meet greater than or equal to 90% of their needs  MONITOR:   PO intake, Supplement acceptance, Labs, I & O's, Weight trends  REASON FOR ASSESSMENT:   Malnutrition Screening Tool    ASSESSMENT:   83 year old female with a history of dementia presenting with seizure-like activity. Found to have UTI with sepsis   Visited pt's room today. Pt non responsive. History obtained from pt's family at bedside who reports that pt has not eaten anything for three days. Family reports that they prepare pt's meals at home; pts favorite foods are pasta, yogurt, pudding, applesauce, grilled chicken, and cake. Pt needs assistance when eating. Family reports that pt will have days where she will not take food just turn her head and then there are other days when she will clean her plate. Pt prefers soft foods. Per chart, pt has lost 4lbs(4%) in 2 months; this is not significant. Family asking about feeding tube today; explained this will be up MD but likely will allow pt a chance to eat on her own first. Feeding tube placement may not be in line with pt's goals of care. RD will order Magic Cups and Vital cuisine as these are sweet supplements. Recommend SLP evaluation when appropriate. Pt will likely be at high refeeding risk; monitor K, Mg, and P.     Medications reviewed and include: aspirin, heparin, NaCl  @75ml /hr, zosyn, vancomycin   Labs reviewed: BUN 38(H), creat 1.13(H), Ca 7.9(L) Hgb 9.9(L), Hct 29.9(L)  Nutrition-Focused physical exam completed. Findings are moderate fat depletions in arms, severe fat depletions in buccal regions, orbitals, and chest, severe muscle depletions in clavicles, shoulders, and temporal regions, moderate muscle depletions in legs, and no edema.   Diet Order:  Diet regular Room service appropriate? Yes; Fluid consistency: Thin  EDUCATION NEEDS:   Not appropriate for education at this time  Skin: Reviewed RN Assessment  Last BM:  11/29  Height:   Ht Readings from Last 1 Encounters:  07/16/17 5' (1.524 m)    Weight:   Wt Readings from Last 1 Encounters:  07/16/17 112 lb 11.2 oz (51.1 kg)    Ideal Body Weight:  45.4 kg  BMI:  Body mass index is 22.01 kg/m.  Estimated Nutritional Needs:   Kcal:  1200-1400kcal/day   Protein:  66-76g/day   Fluid:  >1.2L/day   Betsey Holidayasey Terek Bee MS, RD, LDN Pager #[phone] After Hours Pager: [phone]

## 2017-07-16 NOTE — Plan of Care (Signed)
  Education: Knowledge of General Education information will improve 07/16/2017 1536 - Not Progressing by Raynald BlendImhoff, Geeta Dworkin M, RN Note Patient possibly in a post-ictal seizure state. Will continue to monitor neuro status. Jari FavreSteven M [facility]

## 2017-07-16 NOTE — Progress Notes (Signed)
Lovenox changed to 30 mg daily for CrCl <30 and BMI <40. 

## 2017-07-16 NOTE — ED Notes (Signed)
Awaiting MD return call prior to calling report.

## 2017-07-16 NOTE — Progress Notes (Signed)
MRI called for patient; this RN informed MRI that she had her antibiotic running.  MRI tech stated they would wait for the antibiotic to finish.  This RN will call when IV is done.

## 2017-07-16 NOTE — ED Notes (Signed)
Report called to floor, given to [place] Bay[place]issa, Charity fundraiserN.

## 2017-07-16 NOTE — ED Notes (Signed)
Attempted to call reports to floor, RN concerned regarding patient's blood pressure and MAP.  Will monitor BP.

## 2017-07-16 NOTE — ED Notes (Signed)
Admitting MD paged 

## 2017-07-16 NOTE — ED Notes (Signed)
Per admitting MD family is requesting that patient be transferred.

## 2017-07-16 NOTE — ED Notes (Signed)
Spoke with MD regarding patient's blood pressure.  Orders being placed.

## 2017-07-17 ENCOUNTER — Inpatient Hospital Stay: Payer: Medicare Other

## 2017-07-17 DIAGNOSIS — R410 Disorientation, unspecified: Secondary | ICD-10-CM

## 2017-07-17 DIAGNOSIS — F0390 Unspecified dementia without behavioral disturbance: Secondary | ICD-10-CM

## 2017-07-17 DIAGNOSIS — N390 Urinary tract infection, site not specified: Secondary | ICD-10-CM

## 2017-07-17 DIAGNOSIS — Z515 Encounter for palliative care: Secondary | ICD-10-CM

## 2017-07-17 DIAGNOSIS — Z7189 Other specified counseling: Secondary | ICD-10-CM

## 2017-07-17 LAB — CBC
HEMATOCRIT: 26.3 % — AB (ref 35.0–47.0)
HEMOGLOBIN: 8.7 g/dL — AB (ref 12.0–16.0)
MCH: 29.6 pg (ref 26.0–34.0)
MCHC: 33.1 g/dL (ref 32.0–36.0)
MCV: 89.2 fL (ref 80.0–100.0)
Platelets: 108 10*3/uL — ABNORMAL LOW (ref 150–440)
RBC: 2.95 MIL/uL — ABNORMAL LOW (ref 3.80–5.20)
RDW: 17 % — AB (ref 11.5–14.5)
WBC: 3.4 10*3/uL — ABNORMAL LOW (ref 3.6–11.0)

## 2017-07-17 LAB — GLUCOSE, CAPILLARY: Glucose-Capillary: 71 mg/dL (ref 65–99)

## 2017-07-17 LAB — BASIC METABOLIC PANEL
Anion gap: 6 (ref 5–15)
BUN: 40 mg/dL — AB (ref 6–20)
CALCIUM: 8 mg/dL — AB (ref 8.9–10.3)
CHLORIDE: 112 mmol/L — AB (ref 101–111)
CO2: 22 mmol/L (ref 22–32)
CREATININE: 1.23 mg/dL — AB (ref 0.44–1.00)
GFR calc Af Amer: 43 mL/min — ABNORMAL LOW (ref >60)
GFR calc non Af Amer: 37 mL/min — ABNORMAL LOW (ref >60)
GLUCOSE: 64 mg/dL — AB (ref 65–99)
Potassium: 3.4 mmol/L — ABNORMAL LOW (ref 3.5–5.1)
Sodium: 140 mmol/L (ref 135–145)

## 2017-07-17 LAB — PHOSPHORUS: Phosphorus: 2.6 mg/dL (ref 2.5–4.6)

## 2017-07-17 LAB — MAGNESIUM: MAGNESIUM: 1.9 mg/dL (ref 1.7–2.4)

## 2017-07-17 NOTE — Progress Notes (Addendum)
Subjective: Patient improved today.  Per family member in the room, patient is close to baseline other than some swallowing issues that have not resolved.  It should be noted though that with further questioning she often has swallowing issues but they just clear faster.    Objective: Current vital signs: BP (!) 101/56 (BP Location: Left Arm)   Pulse 81   Temp 98.1 F (36.7 C) (Oral)   Resp 12   Ht 5' (1.524 m)   Wt 51.1 kg (112 lb 11.2 oz)   SpO2 96%   BMI 22.01 kg/m  Vital signs in last 24 hours: Temp:  [97.5 F (36.4 C)-98.7 F (37.1 C)] 98.1 F (36.7 C) (12/04 0735) Pulse Rate:  [81-90] 81 (12/04 0735) Resp:  [12-17] 12 (12/04 0735) BP: (94-109)/(38-56) 101/56 (12/04 0735) SpO2:  [84 %-97 %] 96 % (12/04 0735)  Intake/Output from previous day: 12/03 0701 - 12/04 0700 In: 1075 [I.V.:825; IV Piggyback:250] Out: -  Intake/Output this shift: No intake/output data recorded. Nutritional status: Diet regular Room service appropriate? Yes; Fluid consistency: Thin  Neurologic Exam: Mental Status: Patient awake.  Speech slurred and incomprehensible.  Does not follow commands.   Cranial Nerves: II: patient does not respond confrontation bilaterally, pupils right 3 mm, left 3 mm,and reactive bilaterally III,IV,VI: doll's response present bilaterally.  V,VII: corneal reflex present bilaterally  VIII: patient does not respond to verbal stimuli IX,X: gag reflex reduced, XI: trapezius strength unable to test bilaterally XII: tongue strength unable to test Motor: Moves upper extremities spontaneously.     Lab Results: Basic Metabolic Panel: Recent Labs  Lab 07/15/17 1948 07/16/17 0605 07/17/17 0344  NA 139 139 140  K 3.7 4.0 3.4*  CL 100* 107 112*  CO2 29 24 22   GLUCOSE 121* 79 64*  BUN 39* 38* 40*  CREATININE 1.04* 1.13* 1.23*  CALCIUM 9.0 7.9* 8.0*  MG  --   --  1.9  PHOS  --   --  2.6    Liver Function Tests: Recent Labs  Lab 07/15/17 1948  AST 34  ALT 19   ALKPHOS 58  BILITOT 0.5  PROT 5.9*  ALBUMIN 2.9*   Recent Labs  Lab 07/15/17 1948  LIPASE 16   No results for input(s): AMMONIA in the last 168 hours.  CBC: Recent Labs  Lab 07/15/17 1948 07/16/17 0605 07/17/17 0344  WBC 3.3* 4.9 3.4*  NEUTROABS 2.7  --   --   HGB 11.8* 9.9* 8.7*  HCT 35.7 29.9* 26.3*  MCV 89.4 89.3 89.2  PLT 118* 110* 108*    Cardiac Enzymes: Recent Labs  Lab 07/15/17 1948  TROPONINI <0.03    Lipid Panel: No results for input(s): CHOL, TRIG, HDL, CHOLHDL, VLDL, LDLCALC in the last 168 hours.  CBG: Recent Labs  Lab 07/17/17 0734  GLUCAP 71    Microbiology: Results for orders placed or performed during the hospital encounter of 07/15/17  Blood Culture (routine x 2)     Status: None (Preliminary result)   Collection Time: 07/15/17  7:48 PM  Result Value Ref Range Status   Specimen Description BLOOD LEFT UPPER FOREARM  Final   Special Requests   Final    BOTTLES DRAWN AEROBIC AND ANAEROBIC Blood Culture results may not be optimal due to an excessive volume of blood received in culture bottles   Culture  Setup Time   Final    GRAM POSITIVE COCCI IN BOTH AEROBIC AND ANAEROBIC BOTTLES CRITICAL RESULT CALLED TO, READ BACK BY  AND VERIFIED WITH: SCOTT CHRISTY [facility]16/17 [facility]    Culture GRAM POSITIVE COCCI  Final   Report Status PENDING  Incomplete  Blood Culture (routine x 2)     Status: None (Preliminary result)   Collection Time: 07/15/17  8:39 PM  Result Value Ref Range Status   Specimen Description BLOOD LEFT ANTECUBITAL  Final   Special Requests   Final    BOTTLES DRAWN AEROBIC AND ANAEROBIC Blood Culture results may not be optimal due to an excessive volume of blood received in culture bottles   Culture  Setup Time   Final    Organism ID to follow AEROBIC BOTTLE ONLY GRAM POSITIVE COCCI    Culture GRAM POSITIVE COCCI  Final   Report Status PENDING  Incomplete  Blood Culture ID Panel (Reflexed)     Status: None   Collection  Time: 07/15/17  8:39 PM  Result Value Ref Range Status   Enterococcus species NOT DETECTED NOT DETECTED Final   Listeria monocytogenes NOT DETECTED NOT DETECTED Final   Staphylococcus species NOT DETECTED NOT DETECTED Final   Staphylococcus aureus NOT DETECTED NOT DETECTED Final   Streptococcus species NOT DETECTED NOT DETECTED Final   Streptococcus agalactiae NOT DETECTED NOT DETECTED Final   Streptococcus pneumoniae NOT DETECTED NOT DETECTED Final   Streptococcus pyogenes NOT DETECTED NOT DETECTED Final   Acinetobacter baumannii NOT DETECTED NOT DETECTED Final   Enterobacteriaceae species NOT DETECTED NOT DETECTED Final   Enterobacter cloacae complex NOT DETECTED NOT DETECTED Final   Escherichia coli NOT DETECTED NOT DETECTED Final   Klebsiella oxytoca NOT DETECTED NOT DETECTED Final   Klebsiella pneumoniae NOT DETECTED NOT DETECTED Final   Proteus species NOT DETECTED NOT DETECTED Final   Serratia marcescens NOT DETECTED NOT DETECTED Final   Haemophilus influenzae NOT DETECTED NOT DETECTED Final   Neisseria meningitidis NOT DETECTED NOT DETECTED Final   Pseudomonas aeruginosa NOT DETECTED NOT DETECTED Final   Candida albicans NOT DETECTED NOT DETECTED Final   Candida glabrata NOT DETECTED NOT DETECTED Final   Candida krusei NOT DETECTED NOT DETECTED Final   Candida parapsilosis NOT DETECTED NOT DETECTED Final   Candida tropicalis NOT DETECTED NOT DETECTED Final  Urine culture     Status: Abnormal (Preliminary result)   Collection Time: 07/15/17  9:52 PM  Result Value Ref Range Status   Specimen Description URINE, RANDOM  Final   Special Requests NONE  Final   Culture >=100,000 COLONIES/mL GRAM NEGATIVE RODS (A)  Final   Report Status PENDING  Incomplete    Coagulation Studies: Recent Labs    07/15/17 1948  LABPROT 14.6  INR 1.15    Imaging: Ct Head Wo Contrast  Result Date: 07/15/2017 CLINICAL DATA:  83 year old female with altered mental status. EXAM: CT HEAD WITHOUT  CONTRAST TECHNIQUE: Contiguous axial images were obtained from the base of the skull through the vertex without intravenous contrast. COMPARISON:  None. FINDINGS: Brain: No evidence of acute infarction, hemorrhage, hydrocephalus, extra-axial collection or mass lesion/mass effect. Moderate cerebral atrophy and chronic small-vessel white matter ischemic changes noted. Vascular: Atherosclerotic calcifications noted. Skull: Normal. Negative for fracture or focal lesion. Sinuses/Orbits: No acute finding. Other: None. IMPRESSION: 1. No evidence of acute intracranial abnormality 2. Atrophy and chronic small-vessel white matter ischemic changes. Electronically Signed   By: Harmon PierJeffrey  Hu M.D.   On: 07/15/2017 21:15   Mr Brain Wo Contrast  Result Date: 07/17/2017 CLINICAL DATA:  Initial evaluation for focal neural deficit, stroke suspected. EXAM: MRI  HEAD WITHOUT CONTRAST TECHNIQUE: Multiplanar, multiecho pulse sequences of the brain and surrounding structures were obtained without intravenous contrast. COMPARISON:  [facility] CT from 07/15/2017. FINDINGS: Brain: Study severely degraded by motion artifact. Diffuse prominence of the CSF containing spaces compatible with generalized age-related cerebral atrophy. T2/FLAIR hyperintensity within the periventricular white matter most likely related chronic small vessel ischemic disease, mild for age. No abnormal foci of restricted diffusion to suggest acute or subacute ischemia. Gray-white matter differentiation maintained. No other evidence for chronic infarction. No acute or chronic intracranial hemorrhage. No appreciable mass lesion. No midline shift or mass effect. No extra-axial fluid collection. Pituitary gland suprasellar region normal. Midline structures intact and normal. Vascular: Major intracranial vascular flow voids are grossly maintained. Skull and upper cervical spine: Craniocervical junction within normal limits. Bone marrow signal intensity grossly normal. No scalp  soft tissue abnormality. Sinuses/Orbits: Globes and orbital soft tissues grossly within normal limits. Patient status post lens extraction bilaterally. Paranasal sinuses grossly clear. No appreciable mastoid effusion. Other: None. IMPRESSION: 1. Motion degraded exam. No definite acute intracranial infarct or other abnormality identified. 2. Age-related cerebral atrophy with mild chronic small vessel ischemic disease. Electronically Signed   By: Rise Mu M.D.   On: 07/17/2017 05:53   Dg Chest Port 1 View  Result Date: 07/15/2017 CLINICAL DATA:  Altered mental status and weakness today. EXAM: PORTABLE CHEST 1 VIEW COMPARISON:  None. FINDINGS: The cardiomediastinal silhouette is unremarkable. Mild left basilar atelectasis with possible trace left pleural effusion noted. There is no evidence of pneumothorax, airspace disease, edema or pulmonary mass. No acute bony abnormalities are identified. IMPRESSION: Mild left basilar atelectasis was possible trace left pleural effusion. Electronically Signed   By: Harmon Pier M.D.   On: 07/15/2017 21:13    Medications:  I have reviewed the patient's current medications. Scheduled: . aspirin  300 mg Rectal Daily  . heparin injection (subcutaneous)  5,000 Units Subcutaneous Q8H  . mouth rinse  15 mL Mouth Rinse BID  . risperiDONE  1 mg Oral Daily    Assessment/Plan: Patient improved today.  UTI.  Started on Zosyn.  MRI of the brain reviewed and shows no acute changes.  EEG only remarkable for slowing.  Patient with multiple episodes of sleeping for 2-3 days and now with seizure like event.  Although work up unremarkable patient may benefit from a AED trial.    Recommendations: 1.  Patient to have swallow evaluation.   2.  Once final determinations from swallow evaluation available would start Keppra at 500mg  BID.  Route of administration to be determined.   3.  Agree with treatment of infection  Case discussed with Dr. Cherlynn Kaiser    LOS: 2 days    Thana Farr, MD Neurology [phone] 07/17/2017  11:24 AM

## 2017-07-17 NOTE — Evaluation (Signed)
Clinical/Bedside Swallow Evaluation Patient Details  Name: Hayley Greene Malecki MRN: [id number] Date of Birth: May 24, 1925  Today's Date: 07/17/2017 Time: SLP Start Time (ACUTE ONLY): 1710 SLP Stop Time (ACUTE ONLY): 1810 SLP Time Calculation (min) (ACUTE ONLY): 60 min  Past Medical History:  Past Medical History:  Diagnosis Date  . Alzheimer disease   . Anxiety    Past Surgical History:  Past Surgical History:  Procedure Laterality Date  . ABDOMINAL HYSTERECTOMY     HPI:  Pt is a 83 year old female with advanced dementia was followed by hospice services at home presents to the hospital due to altered mental status and suspected to have a seizure. Per MD note, Secondary to underlying dementia combined with suspected seizure with postictal state, and also urinary tract infection. Per family member in the room, patient is close to baseline other than some swallowing issues that have not resolved.  It should be noted though that with further questioning she often has swallowing issues but they just clear faster(per MD note).   Assessment / Plan / Recommendation Clinical Impression  Pt appeared to present w/ moderate-severe oropharyngeal phase dysphagia at this assessment and is at increased risk for aspiration secondary to the dysphagia and overall declined Cognitive awareness for the task of oral intake. Pt does have severe Dementia at baseline per chart notes. This was discussed w/ Nursing and w/ Granddaughter in the room(who reported pt has had episodes of dysphagia similar to this but has "usually bounced back more quickly than this"). Of note, pt exhibited a nonvolitional cough however severely weak and nonproductive for airway protection in appearance post the few tsp trials attempted(Honey consistency liquids, puree). Recommended NPO status(family member had attempted to feed pt some of the Lunch tray still in room from earlier today) w/ frequent oral care for hygiene and stimulation of  swallowing. Attempted to page MD; NSG updated/informed and agreed. ST services will f/u tomorrow for ongoing assessment of pt's safety w/ oral intake and hopes to establish a least restrictive diet consistency.  SLP Visit Diagnosis: Dysphagia, oropharyngeal phase (R13.12);Dysphagia, pharyngeal phase (R13.13)    Aspiration Risk  Severe aspiration risk;Risk for inadequate nutrition/hydration    Diet Recommendation  NPO w/ frequent oral care for hygiene and stimulation of swallowing  Medication Administration: Via alternative means    Other  Recommendations Recommended Consults: (Palliative Care consult - pt on Hospice services at home?) Oral Care Recommendations: Oral care QID;Staff/trained caregiver to provide oral care Other Recommendations: (n/a)   Follow up Recommendations (TBD)      Frequency and Duration min 3x week  2 weeks       Prognosis Prognosis for Safe Diet Advancement: Guarded Barriers to Reach Goals: Cognitive deficits;Time post onset;Severity of deficits      Swallow Study   General Date of Onset: 07/15/17 HPI: Pt is a 83 year old female with advanced dementia was followed by hospice services at home presents to the hospital due to altered mental status and suspected to have a seizure. Per MD note, Secondary to underlying dementia combined with suspected seizure with postictal state, and also urinary tract infection. Per family member in the room, patient is close to baseline other than some swallowing issues that have not resolved.  It should be noted though that with further questioning she often has swallowing issues but they just clear faster(per MD note). Type of Study: Bedside Swallow Evaluation Previous Swallow Assessment: none reported but family member stated pt does have swallowing issues baseline  Diet Prior to this Study:  Dysphagia 3 (soft);Thin liquids(softer foods; puree foods per family member) Temperature Spikes Noted: No(wbc 3.4) Respiratory Status:  Room air(Mild left basilar atelectasis per CXR) History of Recent Intubation: No Behavior/Cognition: Lethargic/Drowsy;Confused;Requires cueing;Doesn't follow directions(baseline confusion/Dementia) Oral Cavity Assessment: (unable to assess secondary to cognition) Oral Care Completed by SLP: Recent completion by staff Oral Cavity - Dentition: Adequate natural dentition;Missing dentition(few) Vision: (n/a) Self-Feeding Abilities: Total assist Patient Positioning: Upright in bed(supported w/ pillows) Baseline Vocal Quality: Low vocal intensity(garbled; mumbled - low tone per family) Volitional Cough: Cognitively unable to elicit Volitional Swallow: Unable to elicit    Oral/Motor/Sensory Function Overall Oral Motor/Sensory Function: Moderate impairment(overall weak, decreased movements) Facial Symmetry: Within Functional Limits   Ice Chips Ice chips: Not tested   Thin Liquid Thin Liquid: Not tested    Nectar Thick Nectar Thick Liquid: Not tested   Honey Thick Honey Thick Liquid: Impaired Presentation: Spoon(2 (1/2 tsp) trials) Oral Phase Impairments: Reduced labial seal;Reduced lingual movement/coordination;Poor awareness of bolus Oral Phase Functional Implications: Oral residue;Oral holding;Prolonged oral transit(anterior residue) Pharyngeal Phase Impairments: Suspected delayed Swallow;Multiple swallows;Throat Clearing - Delayed;Cough - Delayed   Puree Puree: Impaired Presentation: Spoon(2(1/2 tsp) trials) Oral Phase Impairments: Reduced labial seal;Reduced lingual movement/coordination;Impaired mastication;Poor awareness of bolus Oral Phase Functional Implications: Prolonged oral transit;Oral residue;Oral holding Pharyngeal Phase Impairments: Suspected delayed Swallow;Multiple swallows;Throat Clearing - Delayed;Cough - Delayed;Wet Vocal Quality   Solid   GO   Solid: Not tested    Functional Assessment Tool Used: clinical judgement Functional Limitations: Swallowing Swallow Current  Status ([id number](G8996): At least 80 percent but less than 100 percent impaired, limited or restricted Swallow Goal Status [phone](G8997): At least 20 percent but less than 40 percent impaired, limited or restricted     Jerilynn SomKatherine Suellyn Meenan, MS, CCC-SLP Dorothe Elmore 07/17/2017,6:32 PM

## 2017-07-17 NOTE — Progress Notes (Signed)
[facility] [place] NAME: Hayley Greene    MR#:  [id number]  DATE OF BIRTH:  1924/09/22  SUBJECTIVE:   Patient is bit more awake today. Grand-daughter at bedside. No seizure type activity overnight.   REVIEW OF SYSTEMS:    Review of Systems  Unable to perform ROS: Mental acuity    Nutrition: NPO and await speech eval.  Tolerating Diet: No Tolerating PT: Await Eval.   DRUG ALLERGIES:  No Known Allergies  VITALS:  Blood pressure (!) 101/56, pulse 81, temperature 98.1 F (36.7 C), temperature source Oral, resp. rate 12, height 5' (1.524 m), weight 51.1 kg (112 lb 11.2 oz), SpO2 96 %.  PHYSICAL EXAMINATION:   Physical Exam  GENERAL:  83 y.o.-year-old patient lying in bed encephalopathic but opens eyes.    EYES: Pupils equal, round, reactive to light. No scleral icterus.  HEENT: Head atraumatic, normocephalic. Oropharynx and nasopharynx clear. Dry Oral mucosa.  NECK:  Supple, no jugular venous distention. No thyroid enlargement, no tenderness.  LUNGS: Normal breath sounds bilaterally, no wheezing, rales, rhonchi. No use of accessory muscles of respiration.  CARDIOVASCULAR: S1, S2 normal. No murmurs, rubs, or gallops.  ABDOMEN: Soft, nontender, nondistended. Bowel sounds present. No organomegaly or mass.  EXTREMITIES: No cyanosis, clubbing or edema b/l.    NEUROLOGIC: Difficult to assess a full neurological exam given the patient's encephalopathy. Globally weak. PSYCHIATRIC: The patient is alert and oriented x 1. Encephalopathic  SKIN: No obvious rash, lesion, or ulcer.    LABORATORY PANEL:   CBC Recent Labs  Lab 07/17/17 0344  WBC 3.4*  HGB 8.7*  HCT 26.3*  PLT 108*   ------------------------------------------------------------------------------------------------------------------  Chemistries  Recent Labs  Lab 07/15/17 1948  07/17/17 0344  NA 139   < > 140  K 3.7   < > 3.4*  CL 100*   < > 112*  CO2 29   < > 22   GLUCOSE 121*   < > 64*  BUN 39*   < > 40*  CREATININE 1.04*   < > 1.23*  CALCIUM 9.0   < > 8.0*  MG  --   --  1.9  AST 34  --   --   ALT 19  --   --   ALKPHOS 58  --   --   BILITOT 0.5  --   --    < > = values in this interval not displayed.   ------------------------------------------------------------------------------------------------------------------  Cardiac Enzymes Recent Labs  Lab 07/15/17 1948  TROPONINI <0.03   ------------------------------------------------------------------------------------------------------------------  RADIOLOGY:  Ct Head Wo Contrast  Result Date: 07/15/2017 CLINICAL DATA:  83 year old female with altered mental status. EXAM: CT HEAD WITHOUT CONTRAST TECHNIQUE: Contiguous axial images were obtained from the base of the skull through the vertex without intravenous contrast. COMPARISON:  None. FINDINGS: Brain: No evidence of acute infarction, hemorrhage, hydrocephalus, extra-axial collection or mass lesion/mass effect. Moderate cerebral atrophy and chronic small-vessel white matter ischemic changes noted. Vascular: Atherosclerotic calcifications noted. Skull: Normal. Negative for fracture or focal lesion. Sinuses/Orbits: No acute finding. Other: None. IMPRESSION: 1. No evidence of acute intracranial abnormality 2. Atrophy and chronic small-vessel white matter ischemic changes. Electronically Signed   By: Margarette Canada M.D.   On: 07/15/2017 21:15   Mr Brain Wo Contrast  Result Date: 07/17/2017 CLINICAL DATA:  Initial evaluation for focal neural deficit, stroke suspected. EXAM: MRI HEAD WITHOUT CONTRAST TECHNIQUE: Multiplanar, multiecho pulse sequences of the brain and surrounding structures were  obtained without intravenous contrast. COMPARISON:  [facility] CT from 07/15/2017. FINDINGS: Brain: Study severely degraded by motion artifact. Diffuse prominence of the CSF containing spaces compatible with generalized age-related cerebral atrophy. T2/FLAIR  hyperintensity within the periventricular white matter most likely related chronic small vessel ischemic disease, mild for age. No abnormal foci of restricted diffusion to suggest acute or subacute ischemia. Gray-white matter differentiation maintained. No other evidence for chronic infarction. No acute or chronic intracranial hemorrhage. No appreciable mass lesion. No midline shift or mass effect. No extra-axial fluid collection. Pituitary gland suprasellar region normal. Midline structures intact and normal. Vascular: Major intracranial vascular flow voids are grossly maintained. Skull and upper cervical spine: Craniocervical junction within normal limits. Bone marrow signal intensity grossly normal. No scalp soft tissue abnormality. Sinuses/Orbits: Globes and orbital soft tissues grossly within normal limits. Patient status post lens extraction bilaterally. Paranasal sinuses grossly clear. No appreciable mastoid effusion. Other: None. IMPRESSION: 1. Motion degraded exam. No definite acute intracranial infarct or other abnormality identified. 2. Age-related cerebral atrophy with mild chronic small vessel ischemic disease. Electronically Signed   By: Jeannine Boga M.D.   On: 07/17/2017 05:53   Dg Chest Port 1 View  Result Date: 07/15/2017 CLINICAL DATA:  Altered mental status and weakness today. EXAM: PORTABLE CHEST 1 VIEW COMPARISON:  None. FINDINGS: The cardiomediastinal silhouette is unremarkable. Mild left basilar atelectasis with possible trace left pleural effusion noted. There is no evidence of pneumothorax, airspace disease, edema or pulmonary mass. No acute bony abnormalities are identified. IMPRESSION: Mild left basilar atelectasis was possible trace left pleural effusion. Electronically Signed   By: Margarette Canada M.D.   On: 07/15/2017 21:13     ASSESSMENT AND PLAN:   83 year old female with advanced dementia was followed by hospice services at home presents to the hospital due to altered  mental status and suspected to have a seizure.  1. Altered mental status-etiology unclear and suspected to be a multifactorial source. -Secondary to underlying dementia combined with suspected seizure with postictal state, and also urinary tract infection. -CT head on admission was negative for acute pathology. Appreciate neurology consult and status post MRI which was negative for acute pathology. Patient's EEG is also negative for seizures. -Continue IV Zosyn, follow cultures. Mental status slightly improved today.  2. Suspected seizures-patient noted to have seizure type activity and then developed postictal state and was incontinent of urine as per the granddaughter on admission. -Appreciate nephrology input no acute indication for antiepileptic treatment at this time. -MRI of the brain negative for acute pathology. EEG negative for seizures.  3. Urinary tract infection-continue IV Zosyn, urine cultures growing 100,000 colonies of gram-negative rods but has not been identified yet.  4. Sepsis-patient met criteria given her hypothermia, hypotension also abnormal urinalysis. -Continue IV Zosyn, follow cultures as mentioned. Currently afebrile and hemodynamically stable.   Given patient's advanced dementia she probably has a very poor prognosis. Await Palliative Care consult to discuss goals of care.   All the records are reviewed and case discussed with Care Management/Social Worker. Management plans discussed with the patient, family and they are in agreement.  CODE STATUS: Full code  DVT Prophylaxis: Lovenox  TOTAL TIME TAKING CARE OF THIS PATIENT: 30 minutes.   POSSIBLE D/C IN 2-3 DAYS, DEPENDING ON CLINICAL CONDITION.   Henreitta Leber M.D on 07/17/2017 at 2:46 PM  Between 7am to 6pm - Pager - [phone]  After 6pm go to [email or web address] - [facility]  Office  [phone]  CC: Primary care physician; Hospital, [place]

## 2017-07-17 NOTE — Care Management (Signed)
Spoke with patient's grand daughter Hayley Greene.  [place] but was visiting  with another family member in [place] when she had what appeared to be a seizure. It is planned that [place] at [place]. She was [facility] [place]- [phone].  Since patient has been admitted- the hospice agency has discharged from services.  Hayley Greene says that will resume hospice services when [place] [facility] [place].  Says that at baseline, that is able to ambulate with assist but over the last 2-3 days she has not been able to do so and has been sleeping a lot and not eating.  It is difficult for Tonya to watch patient not eating and getting dehydrated.  Discussed code status and CM asked if family would want patient to be shocked and have chest compressions.  Hayley Greene says "this is something we are going to have to really consider." She says she wants sx treated- such as uti and IVF for dehydration. There is concern that family is going to consider a feeding tube. Reinforced what palliative has discussed- even though it is hard- family needs to consider what the patient would want- not necessarily what family wants and preserving dignity of patient. Hayley Greene mentions going to a facility for care.  CM discussed that unless patient has rehab potential, medicare would not cover skilled nursing (this patient has [place]).  Discussed applying for medicaid if feel like patient is going to need facility placement.  Also discussed that if patient required ems transport to [place], most likely would not cover.

## 2017-07-17 NOTE — Consult Note (Signed)
Consultation Note Date: 07/17/2017   Patient Name: Hayley Greene  DOB: 24-Jan-1925  MRN: [id number]  Age / Sex: 83 y.o., female  [facility] Referring Physician: Houston SirenSainani, Vivek J, MD  Reason for Consultation: Establishing goals of care and Psychosocial/spiritual support  HPI/Patient Profile: 83 y.o. female  admitted on 07/15/2017 with increased confusion, hypothermia, possible seizure event.   Patient has history of end-stage dementia, is totally dependent for all ADLs, has increased difficulty with swallowing and is a patient [facility] [place].  Apparently she was staying with a family member in [place] when she had some episode of shaking, eyes rolled back in her head, and was transported to the hospital locally for evaluation.  MRI of the brain was negative for acute pathology.  EEG was negative for seizure.  Urine culture positive and currently being treated with IV antibiotics.  In light of the patient's terminal disease and end-stage disease process of dementia family face treatment option decisions, advance directive decisions and anticipatory care needs.   Clinical Assessment and Goals of Care:  This NP Lorinda CreedMary Akash Winski reviewed medical records, received report from team, assessed the patient and then meet at the patient's bedside along with her grand-daughter and another caregiver to discuss diagnosis, prognosis, GOC, EOL wishes disposition and options.  Detailed discussion was had today regarding terminal disease of dementia and its natural trajectory and expectations. Discussed with family the importance of goals of care being patient centered.  A detailed discussion was had today regarding advanced directives.  Concepts specific to code status, artifical feeding and hydration, continued IV antibiotics and rehospitalization was had.  The difference between a aggressive  medical intervention path  and a palliative comfort care path for this patient at this time was had.  Values and goals of care important to patient and family were attempted to be elicited.  Concept of Hospice and Palliative Care were discussed  Natural trajectory and expectations at EOL were discussed.  Questions and concerns addressed.   Family encouraged to call with questions or concerns.  PMT will continue to support holistically.   SUMMARY OF RECOMMENDATIONS    Code Status/Advance Care Planning:  Full code-family encouraged to consider DNR/DNI status knowing poor outcomes in similar patients.  Resuscitation in this patient is not recommended   Palliative Prophylaxis:   Aspiration, Bowel Regimen, Delirium Protocol, Frequent Pain Assessment and Oral Care  Additional Recommendations (Limitations, Scope, Preferences):  Full Scope Treatment   Granddaughter states that they are open to all offered and available medical interventions to prolong life.  They are hopeful for evaluation for PEG placement.  Education offered in regards to artificial feeding and PEG placement and end-stage dementia--headache is not recommended and does not offer research based increased mortality.   Psycho-social/Spiritual:   Desire for further Chaplaincy support:yes  Additional Recommendations: Education on Hospice  Prognosis:   < 6 months depends on desire for life prolonging meausres  Discharge Planning:  Family plans for patient to return back to [place] [facility]   Home with Hospice      Primary Diagnoses: Present on Admission: . Sepsis (HCC) . UTI (urinary tract infection) . Dementia   I have reviewed the medical record, interviewed the patient and family, and examined the patient. The following aspects are pertinent.  Past Medical History:  Diagnosis Date  . Alzheimer disease   . Anxiety    Social History    Socioeconomic History  . Marital status: Widowed    Spouse name: None  . Number of children: None  . Years of education: None  . Highest education level: None  Social Needs  . Financial resource strain: None  . Food insecurity - worry: None  . Food insecurity - inability: None  . Transportation needs - medical: None  . Transportation needs - non-medical: None  Occupational History  . None  Tobacco Use  . Smoking status: Never Smoker  . Smokeless tobacco: Never Used  Substance and Sexual Activity  . Alcohol use: No    Frequency: Never  . Drug use: No  . Sexual activity: None  Other Topics Concern  . None  Social History Narrative  . None   Family History  Problem Relation Age of Onset  . Stroke Brother    Scheduled Meds: . aspirin  300 mg Rectal Daily  . heparin injection (subcutaneous)  5,000 Units Subcutaneous Q8H  . mouth rinse  15 mL Mouth Rinse BID  . risperiDONE  1 mg Oral Daily   Continuous Infusions: . sodium chloride 75 mL/hr at 07/17/17 0600  . piperacillin-tazobactam (ZOSYN)  IV 3.375 g (07/17/17 1105)  . sodium chloride 1,000 mL (07/16/17 0207)   PRN Meds:.acetaminophen **OR** acetaminophen, ondansetron **OR** ondansetron (ZOFRAN) IV Medications Prior to Admission:  Prior to Admission medications   Medication Sig Start Date End Date Taking? Authorizing Provider  risperiDONE (RISPERDAL) 0.5 MG tablet Take 1 mg by mouth daily.   Yes [provider]   No Known Allergies Review of Systems  Unable to perform ROS: Dementia    Physical Exam  Constitutional: She appears lethargic. She appears cachectic. She appears ill.  Cardiovascular: Normal rate, regular rhythm and normal heart sounds.  Pulmonary/Chest: She has decreased breath sounds.  Musculoskeletal:  -Unrealized weakness and muscle atrophy  Neurological: She appears lethargic. She displays atrophy.  Skin: Skin is warm and dry.    Vital Signs: BP (!) 101/56 (BP Location: Left Arm)    Pulse 81   Temp 98.1 F (36.7 C) (Oral)   Resp 12   Ht 5' (1.524 m)   Wt 51.1 kg (112 lb 11.2 oz)   SpO2 96%   BMI 22.01 kg/m          SpO2: SpO2: 96 % O2 Device:SpO2: 96 % O2 Flow Rate: .   IO: Intake/output summary:   Intake/Output Summary (Last 24 hours) at 07/17/2017 1502 Last data filed at 07/17/2017 16100605 Gross per 24 hour  Intake 1075 ml  Output -  Net 1075 ml    LBM: Last BM Date: 07/12/17 Baseline Weight: Weight: 49.9 kg (110 lb) Most recent weight: Weight: 51.1 kg (112 lb 11.2 oz)     Palliative Assessment/Data: 20%   Discussed with Dr Cherlynn KaiserSainani  Time In: 1330 Time Out: 1445 Time Total: 75 min Greater than 50%  of this time was spent counseling and coordinating care related to the above assessment and plan.  Signed by: Lorinda CreedMary Ninel Abdella, NP   Please contact Palliative  Medicine Team phone at [phone] for questions and concerns.  For individual provider: See Shea Evans

## 2017-07-18 DIAGNOSIS — Z515 Encounter for palliative care: Secondary | ICD-10-CM

## 2017-07-18 DIAGNOSIS — Z7189 Other specified counseling: Secondary | ICD-10-CM

## 2017-07-18 LAB — BASIC METABOLIC PANEL
Anion gap: 5 (ref 5–15)
BUN: 24 mg/dL — AB (ref 6–20)
CALCIUM: 8.1 mg/dL — AB (ref 8.9–10.3)
CHLORIDE: 114 mmol/L — AB (ref 101–111)
CO2: 24 mmol/L (ref 22–32)
CREATININE: 0.97 mg/dL (ref 0.44–1.00)
GFR, EST AFRICAN AMERICAN: 58 mL/min — AB (ref >60)
GFR, EST NON AFRICAN AMERICAN: 50 mL/min — AB (ref >60)
Glucose, Bld: 67 mg/dL (ref 65–99)
Potassium: 3.1 mmol/L — ABNORMAL LOW (ref 3.5–5.1)
SODIUM: 143 mmol/L (ref 135–145)

## 2017-07-18 LAB — BLOOD CULTURE ID PANEL (REFLEXED)
ACINETOBACTER BAUMANNII: NOT DETECTED
CANDIDA ALBICANS: NOT DETECTED
CANDIDA KRUSEI: NOT DETECTED
CANDIDA PARAPSILOSIS: NOT DETECTED
Candida glabrata: NOT DETECTED
Candida tropicalis: NOT DETECTED
ENTEROBACTERIACEAE SPECIES: NOT DETECTED
ENTEROCOCCUS SPECIES: NOT DETECTED
ESCHERICHIA COLI: NOT DETECTED
Enterobacter cloacae complex: NOT DETECTED
Haemophilus influenzae: NOT DETECTED
KLEBSIELLA OXYTOCA: NOT DETECTED
KLEBSIELLA PNEUMONIAE: NOT DETECTED
LISTERIA MONOCYTOGENES: NOT DETECTED
Neisseria meningitidis: NOT DETECTED
Proteus species: NOT DETECTED
Pseudomonas aeruginosa: NOT DETECTED
STREPTOCOCCUS PYOGENES: NOT DETECTED
Serratia marcescens: NOT DETECTED
Staphylococcus aureus (BCID): NOT DETECTED
Staphylococcus species: NOT DETECTED
Streptococcus agalactiae: NOT DETECTED
Streptococcus pneumoniae: NOT DETECTED
Streptococcus species: NOT DETECTED

## 2017-07-18 LAB — URINE CULTURE

## 2017-07-18 LAB — MAGNESIUM: MAGNESIUM: 1.8 mg/dL (ref 1.7–2.4)

## 2017-07-18 LAB — PHOSPHORUS: Phosphorus: 2.3 mg/dL — ABNORMAL LOW (ref 2.5–4.6)

## 2017-07-18 MED ORDER — DEXTROSE 5 % IV SOLN
1.0000 g | Freq: Every day | INTRAVENOUS | Status: DC
  Filled 2017-07-18: qty 10

## 2017-07-18 MED ORDER — PIPERACILLIN-TAZOBACTAM 3.375 G IVPB
3.3750 g | Freq: Three times a day (TID) | INTRAVENOUS | Status: DC
  Administered 2017-07-18 – 2017-07-21 (×8): 3.375 g via INTRAVENOUS
  Filled 2017-07-18 (×8): qty 50

## 2017-07-18 MED ORDER — POTASSIUM CHLORIDE 20 MEQ PO PACK
40.0000 meq | PACK | Freq: Once | ORAL | Status: AC
  Administered 2017-07-18: 40 meq via ORAL
  Filled 2017-07-18: qty 2

## 2017-07-18 NOTE — Progress Notes (Signed)
[facility] [place] [facility]   PATIENT NAME: Hayley LowersLuvenia Greene    MR#:  [id number]  DATE OF BIRTH:  01/12/1925  SUBJECTIVE:   Mental status somewhat improved since yesterday. Patient is more awake but difficult to understand. Granddaughter at bedside. Seen by speech therapy and started on a dysphagia 1 diet with honey thick liquids.  REVIEW OF SYSTEMS:    Review of Systems  Unable to perform ROS: Mental acuity    Nutrition: Dysphagia 1 with honey thick Tolerating Diet: yes Tolerating PT: Await Eval.   DRUG ALLERGIES:  No Known Allergies  VITALS:  Blood pressure (!) 131/55, pulse 79, temperature 98.2 F (36.8 C), resp. rate 18, height 5' (1.524 m), weight 51.1 kg (112 lb 11.2 oz), SpO2 100 %.  PHYSICAL EXAMINATION:   Physical Exam  GENERAL:  83 y.o.-year-old patient lying in bed awake but in NAD EYES: Pupils equal, round, reactive to light. No scleral icterus.  HEENT: Head atraumatic, normocephalic. Oropharynx and nasopharynx clear. Dry Oral mucosa.  NECK:  Supple, no jugular venous distention. No thyroid enlargement, no tenderness.  LUNGS: Normal breath sounds bilaterally, no wheezing, rales, rhonchi. No use of accessory muscles of respiration.  CARDIOVASCULAR: S1, S2 normal. No murmurs, rubs, or gallops.  ABDOMEN: Soft, nontender, nondistended. Bowel sounds present. No organomegaly or mass.  EXTREMITIES: No cyanosis, clubbing or edema b/l.    NEUROLOGIC: CN II-XII intact, no focal motor or sensory deficits appreciated bilaterally. Globally weak. PSYCHIATRIC: The patient is alert and oriented x 1. Encephalopathic  SKIN: No obvious rash, lesion, or ulcer.    LABORATORY PANEL:   CBC Recent Labs  Lab 07/17/17 0344  WBC 3.4*  HGB 8.7*  HCT 26.3*  PLT 108*   ------------------------------------------------------------------------------------------------------------------  Chemistries  Recent Labs  Lab 07/15/17 1948  07/18/17 0527  NA 139   <  > 143  K 3.7   < > 3.1*  CL 100*   < > 114*  CO2 29   < > 24  GLUCOSE 121*   < > 67  BUN 39*   < > 24*  CREATININE 1.04*   < > 0.97  CALCIUM 9.0   < > 8.1*  MG  --    < > 1.8  AST 34  --   --   ALT 19  --   --   ALKPHOS 58  --   --   BILITOT 0.5  --   --    < > = values in this interval not displayed.   ------------------------------------------------------------------------------------------------------------------  Cardiac Enzymes Recent Labs  Lab 07/15/17 1948  TROPONINI <0.03   ------------------------------------------------------------------------------------------------------------------  RADIOLOGY:  Mr Brain Wo Contrast  Result Date: 07/17/2017 CLINICAL DATA:  Initial evaluation for focal neural deficit, stroke suspected. EXAM: MRI HEAD WITHOUT CONTRAST TECHNIQUE: Multiplanar, multiecho pulse sequences of the brain and surrounding structures were obtained without intravenous contrast. COMPARISON:  [facility] CT from 07/15/2017. FINDINGS: Brain: Study severely degraded by motion artifact. Diffuse prominence of the CSF containing spaces compatible with generalized age-related cerebral atrophy. T2/FLAIR hyperintensity within the periventricular white matter most likely related chronic small vessel ischemic disease, mild for age. No abnormal foci of restricted diffusion to suggest acute or subacute ischemia. Gray-white matter differentiation maintained. No other evidence for chronic infarction. No acute or chronic intracranial hemorrhage. No appreciable mass lesion. No midline shift or mass effect. No extra-axial fluid collection. Pituitary gland suprasellar region normal. Midline structures intact and normal. Vascular: Major intracranial vascular flow voids are grossly  maintained. Skull and upper cervical spine: Craniocervical junction within normal limits. Bone marrow signal intensity grossly normal. No scalp soft tissue abnormality. Sinuses/Orbits: Globes and orbital soft tissues  grossly within normal limits. Patient status post lens extraction bilaterally. Paranasal sinuses grossly clear. No appreciable mastoid effusion. Other: None. IMPRESSION: 1. Motion degraded exam. No definite acute intracranial infarct or other abnormality identified. 2. Age-related cerebral atrophy with mild chronic small vessel ischemic disease. Electronically Signed   By: Rise MuBenjamin  McClintock M.D.   On: 07/17/2017 05:53     ASSESSMENT AND PLAN:   83 year old female with advanced dementia was followed by hospice services at home presents to the hospital due to altered mental status and suspected to have a seizure.  1. Altered mental status-etiology unclear and suspected to be a multifactorial source. -Secondary to underlying dementia combined with suspected seizure with postictal state, and also urinary tract infection. -CT head, MRI of the brain negative for acute stroke.  -Urine cultures are positive for enterococcus and Escherichia coli. Continue IV Zosyn for now. Mental status improving.   2. Suspected seizures-patient noted to have seizure type activity and then developed postictal state and was incontinent of urine as per the granddaughter on admission.  No further seizures while in hospital.  - Appreciate neurology input and we'll start the patient on low-dose Keppra today. EEG was negative for seizure type activity.the brain.  3. Urinary tract infection-patient's urine culture was positive for enterococcus and Escherichia coli. -Continue IV Zosyn for now and will switch to oral meds upon discharge.   4. Sepsis-patient with criteria given her hypothermia, hypotension also abnormal urinalysis. -Continue IV Zosyn. Currently afebrile and hemodynamically stable.  Appreciate palliative care consult and patient remains a full code. Continue full scope of treatment for now. Possible discharge home in the next 24-48 hours if continues to improve.  All the records are reviewed and case  discussed with Care Management/Social Worker. Management plans discussed with the patient, family and they are in agreement.  CODE STATUS: Full code  DVT Prophylaxis: Lovenox  TOTAL TIME TAKING CARE OF THIS PATIENT: 30 minutes.   POSSIBLE D/C IN 1-2 DAYS, DEPENDING ON CLINICAL CONDITION.   Houston SirenSAINANI,Emile Ringgenberg J M.D on 07/18/2017 at 2:47 PM  Between 7am to 6pm - Pager - [phone]  After 6pm go to [email or web address] - Therapist, nutritionalpassword EPAS [facility]  [facility] [place] [facility]  Office  [phone]  CC: Primary care physician; [facility]

## 2017-07-18 NOTE — Progress Notes (Addendum)
Speech Language Pathology Treatment: Dysphagia  Patient Details Name: Cecilie LowersLuvenia Bonilla MRN: [id number] DOB: [birth year] Today's Date: 07/18/2017 Time: 1050-1150 SLP Time Calculation (min) (ACUTE ONLY): 60 min  Assessment / Plan / Recommendation Clinical Impression  Pt seen for ongoing assessment of swallow function and safety for an oral diet today. Pt's BSE was completed last PM, and pt was recommended to be NPO secondary to increased dysphagia noted and risk for aspiration. Pt appears more alert/awake and is more verbal w/ somewhat clearer speech, more effort to the speech and a few words were intelligible. Granddaughter described that pt at baseline held food orally at home b/f swallowing and that they had to "remind her" tom swallow at times.   Pt consumed TSP trials of Honey consistency liquids and 1/2 TSP trials of puree w/ no overt, s/s of aspiration noted. No overt coughing or throat clearing followed trials; no decline in respiratory status was noted during/post trials. When pt verbalized, no wet/gurgly vocal quality was appreciated. Pt did exhibit the oral phase deficits described by the family - she exhibited oral holding w/ prolonged A-P transfer of puree and honey consistency liquid trials. Given time and verbal/tactile/visual cues, pt transferred and swallowed each bolus. Oral clearing noted post swallow but when pt orally held the boluses, she did attempt to mumble/talk w/ bolus residue observed anteriorly. Educated Granddaughter on the movements seen in neck area(Adam's Apple elevation) to notice when pt had completed a swallow. Pt also exhibits audible swallows so encouraged less noise/distraction in the room, turning the TV off during meals in order to observe the swallowing. Pt required full feeding support, encouragement w/ po's. She was often easily distracted.  Due to pt's improved alertness to oral intake and the task of swallowing, recommend initiating an oral diet of dysphagia level  1(puree) w/ Honey consistency liquids; strict aspiration precautions; Pills Crushed in Puree; full feeding assistance and monitoring for s/s of aspiration. NSG/MD/CM updated. ST services will f/u tomorrow w/ pt's progress and toleration of diet. Granddaughter agreed.     HPI HPI: Pt is a [age] year old female with advanced dementia was followed by hospice services at home presents to the hospital due to altered mental status and suspected to have a seizure. Per MD note, Secondary to underlying dementia combined with suspected seizure with postictal state, and also urinary tract infection. Per family member in the room, patient is close to baseline other than some swallowing issues that have not resolved.  It should be noted though that with further questioning she often has swallowing issues but they just clear faster(per MD note). Granddaughter stated pt "holds the food in her mouth at home" but given time and verbal encouragement, she swallows and clears.       SLP Plan  Continue with current plan of care       Recommendations  Diet recommendations: Dysphagia 1 (puree);Honey-thick liquid Liquids provided via: Teaspoon Medication Administration: Crushed with puree Compensations: Minimize environmental distractions;Slow rate;Small sips/bites;Lingual sweep for clearance of pocketing;Follow solids with liquid;Multiple dry swallows after each bite/sip Postural Changes and/or Swallow Maneuvers: Seated upright 90 degrees;Upright 30-60 min after meal                General recommendations: (Dietician f/u) Oral Care Recommendations: Oral care BID;Staff/trained caregiver to provide oral care Follow up Recommendations: Skilled Nursing facility(TBD) SLP Visit Diagnosis: Dysphagia, oropharyngeal phase (R13.12);Dysphagia, pharyngeal phase (R13.13) Plan: Continue with current plan of care       GO  Jerilynn SomKatherine Kenley Troop, MS, CCC-SLP [place] Gangemi 07/18/2017, 2:56 PM

## 2017-07-18 NOTE — Progress Notes (Signed)
PHARMACY - PHYSICIAN COMMUNICATION CRITICAL VALUE ALERT - BLOOD CULTURE IDENTIFICATION (BCID)  Hayley Greene is an 83 y.o. female who [facility] with a UTI.   Name of physician (or Provider) Contacted: Dr. Cherlynn KaiserSainani  Current antibiotics: Piperacillin/tazobactam  Changes to prescribed antibiotics recommended: No changes. Continue piperacillin/tazobactam.  Results for orders placed or performed during the hospital encounter of 07/15/17  Blood Culture ID Panel (Reflexed) (Collected: 07/15/2017  8:39 PM)  Result Value Ref Range   Enterococcus species NOT DETECTED NOT DETECTED   Listeria monocytogenes NOT DETECTED NOT DETECTED   Staphylococcus species NOT DETECTED NOT DETECTED   Staphylococcus aureus NOT DETECTED NOT DETECTED   Streptococcus species NOT DETECTED NOT DETECTED   Streptococcus agalactiae NOT DETECTED NOT DETECTED   Streptococcus pneumoniae NOT DETECTED NOT DETECTED   Streptococcus pyogenes NOT DETECTED NOT DETECTED   Acinetobacter baumannii NOT DETECTED NOT DETECTED   Enterobacteriaceae species NOT DETECTED NOT DETECTED   Enterobacter cloacae complex NOT DETECTED NOT DETECTED   Escherichia coli NOT DETECTED NOT DETECTED   Klebsiella oxytoca NOT DETECTED NOT DETECTED   Klebsiella pneumoniae NOT DETECTED NOT DETECTED   Proteus species NOT DETECTED NOT DETECTED   Serratia marcescens NOT DETECTED NOT DETECTED   Haemophilus influenzae NOT DETECTED NOT DETECTED   Neisseria meningitidis NOT DETECTED NOT DETECTED   Pseudomonas aeruginosa NOT DETECTED NOT DETECTED   Candida albicans NOT DETECTED NOT DETECTED   Candida glabrata NOT DETECTED NOT DETECTED   Candida krusei NOT DETECTED NOT DETECTED   Candida parapsilosis NOT DETECTED NOT DETECTED   Candida tropicalis NOT DETECTED NOT DETECTED    Cindi CarbonMary M Donovan Gatchel, PharmD Clinical Pharmacist 07/18/2017  3:16 PM

## 2017-07-18 NOTE — Care Management (Signed)
Has been placed on dyphsgia, honey thick with supervised feeding and aspiration precautions. A calorie count has been initiated

## 2017-07-19 LAB — CULTURE, BLOOD (ROUTINE X 2)

## 2017-07-19 LAB — MAGNESIUM: MAGNESIUM: 1.7 mg/dL (ref 1.7–2.4)

## 2017-07-19 LAB — PHOSPHORUS: Phosphorus: 2.2 mg/dL — ABNORMAL LOW (ref 2.5–4.6)

## 2017-07-19 MED ORDER — LEVETIRACETAM NICU ORAL SYRINGE 100 MG/ML
250.0000 mg | Freq: Two times a day (BID) | ORAL | Status: DC
  Administered 2017-07-19 – 2017-07-21 (×4): 250 mg via ORAL
  Filled 2017-07-19 (×6): qty 2.5

## 2017-07-19 MED ORDER — ASPIRIN EC 81 MG PO TBEC
81.0000 mg | DELAYED_RELEASE_TABLET | Freq: Every day | ORAL | Status: DC
  Administered 2017-07-19 – 2017-07-21 (×3): 81 mg via ORAL
  Filled 2017-07-19 (×3): qty 1

## 2017-07-19 MED ORDER — LEVETIRACETAM 250 MG PO TABS
250.0000 mg | ORAL_TABLET | Freq: Two times a day (BID) | ORAL | Status: DC
  Administered 2017-07-19: 250 mg via ORAL
  Filled 2017-07-19 (×2): qty 1

## 2017-07-19 MED ORDER — K PHOS MONO-SOD PHOS DI & MONO 155-852-130 MG PO TABS
500.0000 mg | ORAL_TABLET | ORAL | Status: AC
  Administered 2017-07-19: 500 mg via ORAL
  Filled 2017-07-19 (×2): qty 2

## 2017-07-19 NOTE — Plan of Care (Signed)
  Progressing Education: Knowledge of General Education information will improve 07/19/2017 0645 - Progressing by Dorna LeitzNesbitt, Trashaun Streight M, RN Elimination: Will not experience complications related to urinary retention 07/19/2017 0645 - Progressing by Dorna LeitzNesbitt, Montavis Schubring M, RN Skin Integrity: Risk for impaired skin integrity will decrease 07/19/2017 0645 - Progressing by Dorna LeitzNesbitt, Rakeya Glab M, RN

## 2017-07-19 NOTE — Progress Notes (Signed)
Calorie Count Day 1  Estimated Nutritional Needs:   Kcal:  1200-1400kcal/day   Protein:  66-76g/day   Fluid:  >1.2L/day   Lunch 12/5  Estimated Total- 515 kcal and 17.5g protein  Dinner 12/5:  Estimated total- 378 kcal and 12.5g protein  Supplements:  Magic cup TID with meals, each supplement provides 290 kcal and 9 grams of protein  Total-  290 kcal and 9 grams of protein  Total Intake:  1183kcal (99% estimated needs) and  39g protein (59% estimated needs)  Betsey Holidayasey Solomon Skowronek MS, RD, LDN Pager #[phone]- [phone] After Hours Pager: [phone]

## 2017-07-19 NOTE — Plan of Care (Signed)
  Progressing Clinical Measurements: Signs and symptoms of infection will decrease 07/19/2017 2149 - Progressing by Dorna LeitzNesbitt, Khristy Kalan M, RN   Not Progressing Nutrition: Adequate nutrition will be maintained 07/19/2017 2149 - Not Progressing by Dorna LeitzNesbitt, Lashia Niese M, RN Note Patient appetite has been decreased. Eating very small amounts. Calorie count started.

## 2017-07-19 NOTE — Progress Notes (Signed)
Nutrition Follow Up Note   DOCUMENTATION CODES:   Severe malnutrition in context of chronic illness  INTERVENTION:   Pt at refeeding risk; recommend monitor K, Mg, and P  Magic cup TID with meals, each supplement provides 290 kcal and 9 grams of protein  Honey Thick Mighty Shake TID, each supplement provides 200kcal and 7g protein   NUTRITION DIAGNOSIS:   Severe Malnutrition related to chronic illness(dementia and advanced age ) as evidenced by severe fat depletion, severe muscle depletion.  GOAL:   Patient will meet greater than or equal to 90% of their needs  MONITOR:   PO intake, Supplement acceptance, Labs, I & O's, Weight trends  ASSESSMENT:   83 year old female with a history of dementia presenting with seizure-like activity. Found to have UTI with sepsis   Pt more alert today. Pt evaluated by SLP and approved for a dysphagia 1/ honey thick diet. Pt eating ~50% of meals and eating Magic Cups and ice cream supplements. Pt's daughter at beside and assists with meals. Calorie count requested; see calorie count results in separate note. Pt is refeeding; recommend monitor K, P, and Mg. RD would not recommend feeding tube for this pt as pt with advanced dementia, is of advanced age, and is at high risk for aspiration given her seizure history. Would instead encourage intake of supplements at home and multiple small, nutrient dense meals.   Medications reviewed and include: aspirin, heparin, zosyn  Labs reviewed: K 3.1(L)- 12/5 P 2.2(L), Mg 1.7 wnl Wbc- 3.4(L), Hgb 8.7(L), Hct 26.3(L) cbgs- 64, 67 x 48hrs  Diet Order:  DIET - DYS 1 Room service appropriate? Yes with Assist; Fluid consistency: Honey Thick  EDUCATION NEEDS:   Not appropriate for education at this time  Skin: Reviewed RN Assessment  Last BM:  12/4  Height:   Ht Readings from Last 1 Encounters:  07/16/17 5' (1.524 m)    Weight:   Wt Readings from Last 1 Encounters:  07/16/17 112 lb 11.2 oz (51.1  kg)    Ideal Body Weight:  45.4 kg  BMI:  Body mass index is 22.01 kg/m.  Estimated Nutritional Needs:   Kcal:  1200-1400kcal/day   Protein:  66-76g/day   Fluid:  >1.2L/day   Betsey Holidayasey Charniece Venturino MS, RD, LDN Pager #[phone] After Hours Pager: [phone]

## 2017-07-19 NOTE — Progress Notes (Signed)
[facility] - [place] [facility]   PATIENT NAME: Hayley Greene    MR#:  [id number]  DATE OF BIRTH:  1925/04/24  SUBJECTIVE:   Mental status improved since admission. Patient is more awake and is tolerating any oral dysphagia 1 diet with thickened liquids. Patient's granddaughter is at bedside. Patient's blood cultures are positive for staph simulans and awaiting infectious disease input.  REVIEW OF SYSTEMS:    Review of Systems  Unable to perform ROS: Mental acuity    Nutrition: Dysphagia 1 with honey thick Tolerating Diet: yes  DRUG ALLERGIES:  No Known Allergies  VITALS:  Blood pressure (!) 153/97, pulse 63, temperature 97.8 F (36.6 C), resp. rate 18, height 5' (1.524 m), weight 51.1 kg (112 lb 11.2 oz), SpO2 99 %.  PHYSICAL EXAMINATION:   Physical Exam  GENERAL:  83 y.o.-year-old patient lying in bed awake but in NAD EYES: Pupils equal, round, reactive to light. No scleral icterus.  HEENT: Head atraumatic, normocephalic. Oropharynx and nasopharynx clear. Dry Oral mucosa.  NECK:  Supple, no jugular venous distention. No thyroid enlargement, no tenderness.  LUNGS: Normal breath sounds bilaterally, no wheezing, rales, rhonchi. No use of accessory muscles of respiration.  CARDIOVASCULAR: S1, S2 normal. No murmurs, rubs, or gallops.  ABDOMEN: Soft, nontender, nondistended. Bowel sounds present. No organomegaly or mass.  EXTREMITIES: No cyanosis, clubbing or edema b/l.    NEUROLOGIC: CN II-XII intact, no focal motor or sensory deficits appreciated bilaterally. Globally weak. PSYCHIATRIC: The patient is alert and oriented x 1. Encephalopathic  SKIN: No obvious rash, lesion, or ulcer.    LABORATORY PANEL:   CBC Recent Labs  Lab 07/17/17 0344  WBC 3.4*  HGB 8.7*  HCT 26.3*  PLT 108*   ------------------------------------------------------------------------------------------------------------------  Chemistries  Recent Labs  Lab 07/15/17 1948   07/18/17 0527 07/19/17 0417  NA 139   < > 143  --   K 3.7   < > 3.1*  --   CL 100*   < > 114*  --   CO2 29   < > 24  --   GLUCOSE 121*   < > 67  --   BUN 39*   < > 24*  --   CREATININE 1.04*   < > 0.97  --   CALCIUM 9.0   < > 8.1*  --   MG  --    < > 1.8 1.7  AST 34  --   --   --   ALT 19  --   --   --   ALKPHOS 58  --   --   --   BILITOT 0.5  --   --   --    < > = values in this interval not displayed.   ------------------------------------------------------------------------------------------------------------------  Cardiac Enzymes Recent Labs  Lab 07/15/17 1948  TROPONINI <0.03   ------------------------------------------------------------------------------------------------------------------  RADIOLOGY:  No results found.   ASSESSMENT AND PLAN:   83 year old female with advanced dementia was followed by hospice services at home presents to the hospital due to altered mental status and suspected to have a seizure.  1. Altered mental status-suspected to be a multifactorial source. -Secondary to underlying dementia combined with seizure with postictal state, and also urinary tract infection. -CT head, MRI of the brain negative for acute stroke.  -Urine cultures are positive for enterococcus and Escherichia coli. Continue IV Zosyn for now. Mental status improving.   2. Suspected seizures-patient noted to have seizure type activity and then developed postictal  state and was incontinent of urine as per the granddaughter on admission.  No further seizures while in hospital.  - Appreciate neurology input and cont. Low dose Keppra. EEG was negative for seizure type activity.  3. Urinary tract infection-patient's urine culture was positive for enterococcus and Escherichia coli. -Continue IV Zosyn for now and will switch to oral meds upon discharge.   4. Sepsis-patient with criteria given her hypothermia, hypotension also abnormal urinalysis. -Continue IV Zosyn. Currently  afebrile and hemodynamically stable.  5. Bacteremia - pt. BC are + Staph Simulans.  ?? Source or significance of this. Await ID input.  - remains afebrile, hemodynamically stable.    Appreciate palliative care consult and patient remains a full code. Continue full scope of treatment for now.  All the records are reviewed and case discussed with Care Management/Social Worker. Management plans discussed with the patient, family and they are in agreement.  CODE STATUS: Full code  DVT Prophylaxis: Lovenox  TOTAL TIME TAKING CARE OF THIS PATIENT: 30 minutes.   POSSIBLE D/C IN 1-2 DAYS, DEPENDING ON CLINICAL CONDITION.   [place] SirenSAINANI,Jenniferann Stuckert J M.D on 07/19/2017 at 2:57 PM  Between 7am to 6pm - Pager - [phone]  After 6pm go to [email or web address] - Therapist, nutritionalpassword EPAS [facility]  [facility]  Office  [phone]  CC: Primary care physician; [facility]

## 2017-07-19 NOTE — Consult Note (Signed)
[facility] Infectious Disease     Reason for Consult: Bacteremia  Referring Physician: Edwinna Areola of Admission:  07/15/2017   Principal Problem:   Sepsis [facility]) Active Problems:   Lower urinary tract infectious disease   Dementia   DNR (do not resuscitate) discussion   Palliative care by specialist   HPI: Hayley Greene is a 83 y.o. female admitted with AMS and hypothermia and possible seizure. On admit patient temp was 91, wbc 3.3, UA with TNTC Wbc and UCX with E coli and enterococcus. [place] with Staph simulans. CXR neg, MRI brain neg. Started on vanco and zosyn but just on zosyn since 12/4.  Afebrile and MS near baseline. Seen by neuro and EEG neg. Started Keppra.   Past Medical History:  Diagnosis Date  . Alzheimer disease   . Anxiety    Past Surgical History:  Procedure Laterality Date  . ABDOMINAL HYSTERECTOMY     Social History   Tobacco Use  . Smoking status: Never Smoker  . Smokeless tobacco: Never Used  Substance Use Topics  . Alcohol use: No    Frequency: Never  . Drug use: No   Family History  Problem Relation Age of Onset  . Stroke Brother     Allergies: No Known Allergies  Current antibiotics: Antibiotics Given (last 72 hours)    Date/Time Action Medication Dose Rate   07/16/17 2010 New Bag/Given   piperacillin-tazobactam (ZOSYN) IVPB 3.375 g 3.375 g 12.5 mL/hr   07/17/17 0500 New Bag/Given   vancomycin (VANCOCIN) IVPB 750 mg/150 ml premix 750 mg 150 mL/hr   07/17/17 0500 New Bag/Given   piperacillin-tazobactam (ZOSYN) IVPB 3.375 g 3.375 g 12.5 mL/hr   07/17/17 1105 New Bag/Given   piperacillin-tazobactam (ZOSYN) IVPB 3.375 g 3.375 g 12.5 mL/hr   07/17/17 1951 New Bag/Given   piperacillin-tazobactam (ZOSYN) IVPB 3.375 g 3.375 g 12.5 mL/hr   07/18/17 0528 New Bag/Given   piperacillin-tazobactam (ZOSYN) IVPB 3.375 g 3.375 g 12.5 mL/hr   07/18/17 1511 New Bag/Given   piperacillin-tazobactam (ZOSYN) IVPB 3.375 g 3.375 g 12.5 mL/hr   07/18/17 2228  New Bag/Given   piperacillin-tazobactam (ZOSYN) IVPB 3.375 g 3.375 g 12.5 mL/hr   07/19/17 0455 New Bag/Given   piperacillin-tazobactam (ZOSYN) IVPB 3.375 g 3.375 g 12.5 mL/hr   07/19/17 1122 New Bag/Given   piperacillin-tazobactam (ZOSYN) IVPB 3.375 g 3.375 g 12.5 mL/hr      MEDICATIONS: . aspirin EC  81 mg Oral Daily  . heparin injection (subcutaneous)  5,000 Units Subcutaneous Q8H  . levETIRAcetam  250 mg Oral BID  . mouth rinse  15 mL Mouth Rinse BID  . phosphorus  500 mg Oral Q4H  . risperiDONE  1 mg Oral Daily    Review of Systems - unable to obtain   OBJECTIVE: Temp:  [97.5 F (36.4 C)-97.8 F (36.6 C)] 97.8 F (36.6 C) (12/05 2011) Pulse Rate:  [63-81] 63 (12/06 0735) Resp:  [18] 18 (12/06 0735) BP: (128-168)/(72-111) 153/97 (12/06 0735) SpO2:  [94 %-100 %] 99 % (12/06 0735) Physical Exam  Constitutional:  Thin, frail, min responsive. Lying in bed HENT: [place]/AT, PERRLA, no scleral icterus Mouth/Throat: Oropharynx is clear and dry. No oropharyngeal exudate.  Cardiovascular: Normal rate, regular rhythm and normal heart sounds. Pulmonary/Chest: Effort normal and breath sounds normal. No respiratory distress.  has no wheezes.  Neck = supple, no nuchal rigidity Abdominal: Soft. Bowel sounds are normal.  exhibits no distension. There is no tenderness.  Lymphadenopathy: no cervical adenopathy. No axillary adenopathy  Neurological: min responsive Skin: Skin is warm and dry. No rash noted. No erythema.  Psychiatric: unable to obtain  LABS: Results for orders placed or performed during the hospital encounter of 07/15/17 (from the past 48 hour(s))  Phosphorus     Status: Abnormal   Collection Time: 07/18/17  5:27 AM  Result Value Ref Range   Phosphorus 2.3 (L) 2.5 - 4.6 mg/dL  Magnesium     Status: None   Collection Time: 07/18/17  5:27 AM  Result Value Ref Range   Magnesium 1.8 1.7 - 2.4 mg/dL  Basic metabolic panel     Status: Abnormal   Collection Time: 07/18/17  5:27  AM  Result Value Ref Range   Sodium 143 135 - 145 mmol/L   Potassium 3.1 (L) 3.5 - 5.1 mmol/L   Chloride 114 (H) 101 - 111 mmol/L   CO2 24 22 - 32 mmol/L   Glucose, Bld 67 65 - 99 mg/dL   BUN 24 (H) 6 - 20 mg/dL   Creatinine, Ser 0.97 0.44 - 1.00 mg/dL   Calcium 8.1 (L) 8.9 - 10.3 mg/dL   GFR calc non Af Amer 50 (L) >60 mL/min   GFR calc Af Amer 58 (L) >60 mL/min    Comment: (NOTE) The eGFR has been calculated using the CKD EPI equation. This calculation has not been validated in all clinical situations. eGFR's persistently <60 mL/min signify possible Chronic Kidney Disease.    Anion gap 5 5 - 15  Phosphorus     Status: Abnormal   Collection Time: 07/19/17  4:17 AM  Result Value Ref Range   Phosphorus 2.2 (L) 2.5 - 4.6 mg/dL  Magnesium     Status: None   Collection Time: 07/19/17  4:17 AM  Result Value Ref Range   Magnesium 1.7 1.7 - 2.4 mg/dL   No components found for: ESR, C REACTIVE PROTEIN MICRO: Recent Results (from the past 720 hour(s))  Blood Culture (routine x 2)     Status: Abnormal   Collection Time: 07/15/17  7:48 PM  Result Value Ref Range Status   Specimen Description BLOOD LEFT UPPER FOREARM  Final   Special Requests   Final    BOTTLES DRAWN AEROBIC AND ANAEROBIC Blood Culture results may not be optimal due to an excessive volume of blood received in culture bottles   Culture  Setup Time   Final    GRAM POSITIVE COCCI IN BOTH AEROBIC AND ANAEROBIC BOTTLES CRITICAL RESULT CALLED TO, READ BACK BY AND VERIFIED WITH: SCOTT CHRISTY ON 07/16/17 AT 2019 [facility]    Culture STAPHYLOCOCCUS SIMULANS (A)  Final   Report Status 07/19/2017 FINAL  Final   Organism ID, Bacteria STAPHYLOCOCCUS SIMULANS  Final      Susceptibility   Staphylococcus simulans - MIC*    CIPROFLOXACIN <=0.5 SENSITIVE Sensitive     ERYTHROMYCIN >=8 RESISTANT Resistant     GENTAMICIN <=0.5 SENSITIVE Sensitive     OXACILLIN <=0.25 SENSITIVE Sensitive     TETRACYCLINE <=1 SENSITIVE Sensitive      VANCOMYCIN <=0.5 SENSITIVE Sensitive     TRIMETH/SULFA <=10 SENSITIVE Sensitive     CLINDAMYCIN RESISTANT Resistant     RIFAMPIN <=0.5 SENSITIVE Sensitive     Inducible Clindamycin POSITIVE Resistant     * STAPHYLOCOCCUS SIMULANS  Blood Culture (routine x 2)     Status: Abnormal (Preliminary result)   Collection Time: 07/15/17  8:39 PM  Result Value Ref Range Status   Specimen Description BLOOD LEFT ANTECUBITAL  Final  Special Requests   Final    BOTTLES DRAWN AEROBIC AND ANAEROBIC Blood Culture results may not be optimal due to an excessive volume of blood received in culture bottles   Culture  Setup Time   Final    ANAEROBIC BOTTLE ONLY GRAM POSITIVE COCCI CRITICAL RESULT CALLED TO, READ BACK BY AND VERIFIED WITH: SCOTT CHRISTY ON 07/16/17 AT 2019 [facility] GRAM POSITIVE RODS AEROBIC BOTTLE ONLY CRITICAL RESULT CALLED TO, READ BACK BY AND VERIFIED WITH: JASON ROBBINS 07/18/17 @ 1509  [place]    Culture (A)  Final    STAPHYLOCOCCUS SIMULANS SUSCEPTIBILITIES PERFORMED ON PREVIOUS CULTURE WITHIN THE LAST 5 DAYS. GRAM POSITIVE RODS    Report Status PENDING  Incomplete  Blood Culture ID Panel (Reflexed)     Status: None   Collection Time: 07/15/17  8:39 PM  Result Value Ref Range Status   Enterococcus species NOT DETECTED NOT DETECTED Final   Listeria monocytogenes NOT DETECTED NOT DETECTED Final   Staphylococcus species NOT DETECTED NOT DETECTED Final   Staphylococcus aureus NOT DETECTED NOT DETECTED Final   Streptococcus species NOT DETECTED NOT DETECTED Final   Streptococcus agalactiae NOT DETECTED NOT DETECTED Final   Streptococcus pneumoniae NOT DETECTED NOT DETECTED Final   Streptococcus pyogenes NOT DETECTED NOT DETECTED Final   Acinetobacter baumannii NOT DETECTED NOT DETECTED Final   Enterobacteriaceae species NOT DETECTED NOT DETECTED Final   Enterobacter cloacae complex NOT DETECTED NOT DETECTED Final   Escherichia coli NOT DETECTED NOT DETECTED Final   Klebsiella oxytoca NOT  DETECTED NOT DETECTED Final   Klebsiella pneumoniae NOT DETECTED NOT DETECTED Final   Proteus species NOT DETECTED NOT DETECTED Final   Serratia marcescens NOT DETECTED NOT DETECTED Final   Haemophilus influenzae NOT DETECTED NOT DETECTED Final   Neisseria meningitidis NOT DETECTED NOT DETECTED Final   Pseudomonas aeruginosa NOT DETECTED NOT DETECTED Final   Candida albicans NOT DETECTED NOT DETECTED Final   Candida glabrata NOT DETECTED NOT DETECTED Final   Candida krusei NOT DETECTED NOT DETECTED Final   Candida parapsilosis NOT DETECTED NOT DETECTED Final   Candida tropicalis NOT DETECTED NOT DETECTED Final  Blood Culture ID Panel (Reflexed)     Status: None   Collection Time: 07/15/17  8:39 PM  Result Value Ref Range Status   Enterococcus species NOT DETECTED NOT DETECTED Final   Listeria monocytogenes NOT DETECTED NOT DETECTED Final   Staphylococcus species NOT DETECTED NOT DETECTED Final   Staphylococcus aureus NOT DETECTED NOT DETECTED Final   Streptococcus species NOT DETECTED NOT DETECTED Final   Streptococcus agalactiae NOT DETECTED NOT DETECTED Final   Streptococcus pneumoniae NOT DETECTED NOT DETECTED Final   Streptococcus pyogenes NOT DETECTED NOT DETECTED Final   Acinetobacter baumannii NOT DETECTED NOT DETECTED Final   Enterobacteriaceae species NOT DETECTED NOT DETECTED Final   Enterobacter cloacae complex NOT DETECTED NOT DETECTED Final   Escherichia coli NOT DETECTED NOT DETECTED Final   Klebsiella oxytoca NOT DETECTED NOT DETECTED Final   Klebsiella pneumoniae NOT DETECTED NOT DETECTED Final   Proteus species NOT DETECTED NOT DETECTED Final   Serratia marcescens NOT DETECTED NOT DETECTED Final   Haemophilus influenzae NOT DETECTED NOT DETECTED Final   Neisseria meningitidis NOT DETECTED NOT DETECTED Final   Pseudomonas aeruginosa NOT DETECTED NOT DETECTED Final   Candida albicans NOT DETECTED NOT DETECTED Final   Candida glabrata NOT DETECTED NOT DETECTED Final    Candida krusei NOT DETECTED NOT DETECTED Final   Candida parapsilosis NOT DETECTED NOT DETECTED  Final   Candida tropicalis NOT DETECTED NOT DETECTED Final  Urine culture     Status: Abnormal   Collection Time: 07/15/17  9:52 PM  Result Value Ref Range Status   Specimen Description URINE, RANDOM  Final   Special Requests NONE  Final   Culture (A)  Final    >=100,000 COLONIES/mL ESCHERICHIA COLI >=100,000 COLONIES/mL ENTEROCOCCUS FAECALIS    Report Status 07/18/2017 FINAL  Final   Organism ID, Bacteria ESCHERICHIA COLI (A)  Final   Organism ID, Bacteria ENTEROCOCCUS FAECALIS (A)  Final      Susceptibility   Escherichia coli - MIC*    AMPICILLIN >=32 RESISTANT Resistant     CEFAZOLIN <=4 SENSITIVE Sensitive     CEFTRIAXONE <=1 SENSITIVE Sensitive     CIPROFLOXACIN >=4 RESISTANT Resistant     GENTAMICIN <=1 SENSITIVE Sensitive     IMIPENEM <=0.25 SENSITIVE Sensitive     NITROFURANTOIN <=16 SENSITIVE Sensitive     TRIMETH/SULFA >=320 RESISTANT Resistant     AMPICILLIN/SULBACTAM >=32 RESISTANT Resistant     PIP/TAZO <=4 SENSITIVE Sensitive     Extended ESBL NEGATIVE Sensitive     * >=100,000 COLONIES/mL ESCHERICHIA COLI   Enterococcus faecalis - MIC*    AMPICILLIN <=2 SENSITIVE Sensitive     LEVOFLOXACIN 2 SENSITIVE Sensitive     NITROFURANTOIN <=16 SENSITIVE Sensitive     VANCOMYCIN 1 SENSITIVE Sensitive     * >=100,000 COLONIES/mL ENTEROCOCCUS FAECALIS    IMAGING: Ct Head Wo Contrast  Result Date: 07/15/2017 CLINICAL DATA:  83 year old female with altered mental status. EXAM: CT HEAD WITHOUT CONTRAST TECHNIQUE: Contiguous axial images were obtained from the base of the skull through the vertex without intravenous contrast. COMPARISON:  None. FINDINGS: Brain: No evidence of acute infarction, hemorrhage, hydrocephalus, extra-axial collection or mass lesion/mass effect. Moderate cerebral atrophy and chronic small-vessel white matter ischemic changes noted. Vascular:  Atherosclerotic calcifications noted. Skull: Normal. Negative for fracture or focal lesion. Sinuses/Orbits: No acute finding. Other: None. IMPRESSION: 1. No evidence of acute intracranial abnormality 2. Atrophy and chronic small-vessel white matter ischemic changes. Electronically Signed   By: Margarette Canada M.D.   On: 07/15/2017 21:15   Mr Brain Wo Contrast  Result Date: 07/17/2017 CLINICAL DATA:  Initial evaluation for focal neural deficit, stroke suspected. EXAM: MRI HEAD WITHOUT CONTRAST TECHNIQUE: Multiplanar, multiecho pulse sequences of the brain and surrounding structures were obtained without intravenous contrast. COMPARISON:  [facility] CT from 07/15/2017. FINDINGS: Brain: Study severely degraded by motion artifact. Diffuse prominence of the CSF containing spaces compatible with generalized age-related cerebral atrophy. T2/FLAIR hyperintensity within the periventricular white matter most likely related chronic small vessel ischemic disease, mild for age. No abnormal foci of restricted diffusion to suggest acute or subacute ischemia. Gray-white matter differentiation maintained. No other evidence for chronic infarction. No acute or chronic intracranial hemorrhage. No appreciable mass lesion. No midline shift or mass effect. No extra-axial fluid collection. Pituitary gland suprasellar region normal. Midline structures intact and normal. Vascular: Major intracranial vascular flow voids are grossly maintained. Skull and upper cervical spine: Craniocervical junction within normal limits. Bone marrow signal intensity grossly normal. No scalp soft tissue abnormality. Sinuses/Orbits: Globes and orbital soft tissues grossly within normal limits. Patient status post lens extraction bilaterally. Paranasal sinuses grossly clear. No appreciable mastoid effusion. Other: None. IMPRESSION: 1. Motion degraded exam. No definite acute intracranial infarct or other abnormality identified. 2. Age-related cerebral atrophy with  mild chronic small vessel ischemic disease. Electronically Signed   By: Jeannine Boga  M.D.   On: 07/17/2017 05:53   Dg Chest Port 1 View  Result Date: 07/15/2017 CLINICAL DATA:  Altered mental status and weakness today. EXAM: PORTABLE CHEST 1 VIEW COMPARISON:  None. FINDINGS: The cardiomediastinal silhouette is unremarkable. Mild left basilar atelectasis with possible trace left pleural effusion noted. There is no evidence of pneumothorax, airspace disease, edema or pulmonary mass. No acute bony abnormalities are identified. IMPRESSION: Mild left basilar atelectasis was possible trace left pleural effusion. Electronically Signed   By: Margarette Canada M.D.   On: 07/15/2017 21:13    Assessment:   Hayley Greene is a 83 y.o. female with advancing dementia admitted with AMS and seizure like activity. She has a hx of recurrent UTIs and on admit had tntc wbc and ucx with E coli and Enterococcus.  Blood culture with Staph simulans which is a member of the coag neg staph family and is likely contaminant.  Day 5 of zosyn. Family reports she has waxing and waning mental status but has a general decline over the last several months.  Recommendations Repeat bcx - if neg would consider the staph simulans a contaminant.  Continue zosyn for now but at dc can change to oral abx with amoxicillin 500 bid   and keflex 500 bid for a total abx duration of 10 days (from admission date of 12/2 =stop date 12/12 Would try to give liquid formulation of these if possible.  Thank you very much for allowing me to participate in the care of this patient. Please call with questions.   Cheral Marker. Ola Spurr, MD

## 2017-07-19 NOTE — Progress Notes (Signed)
Speech Therapy Note: reviewed chart notes; consulted NSG re: pt's status today. Pt has been sleepy most of day but did awaken intermittently and took bites/sips of Magic Cup and Honey consistency liquid by tsp - no overt s/s of aspiration were reported by NSG; no spike in temp, wbc low, no decline in pulmonary status.  Due to pt's presentation of waxing and waning and of course increased risk for aspiration, recommend feeding po's only when fully awake/alert and remaining on current dysphagia diet of Dysphagia level 1(puree) w/ Honey consistency liquids via tsp. Monitor for any s/s of aspiration and inform MD. Calorie count in place d/t concern for reduced oral intake prior to, and during, this admission. ST services will f/u next 1-3 days for diet toleration.     Hayley SomKatherine Varina Hulon, MS, CCC-SLP

## 2017-07-20 LAB — CULTURE, BLOOD (ROUTINE X 2)

## 2017-07-20 LAB — PHOSPHORUS: Phosphorus: 1.9 mg/dL — ABNORMAL LOW (ref 2.5–4.6)

## 2017-07-20 LAB — POTASSIUM: Potassium: 3.1 mmol/L — ABNORMAL LOW (ref 3.5–5.1)

## 2017-07-20 LAB — MAGNESIUM: Magnesium: 1.6 mg/dL — ABNORMAL LOW (ref 1.7–2.4)

## 2017-07-20 MED ORDER — POTASSIUM PHOSPHATES 15 MMOLE/5ML IV SOLN
20.0000 mmol | Freq: Once | INTRAVENOUS | Status: AC
  Administered 2017-07-20: 20 mmol via INTRAVENOUS
  Filled 2017-07-20: qty 6.67

## 2017-07-20 MED ORDER — MAGNESIUM SULFATE 2 GM/50ML IV SOLN
2.0000 g | Freq: Once | INTRAVENOUS | Status: AC
  Administered 2017-07-20: 2 g via INTRAVENOUS
  Filled 2017-07-20: qty 50

## 2017-07-20 NOTE — Care Management Important Message (Signed)
Important Message  Patient Details  Name: Hayley Greene MRN: [id number] Date of Birth: 1924/10/30   Medicare Important Message Given:  Yes Signed IM notice given    Eber HongGreene, Evolet Salminen R, RN 07/20/2017, 6:50 PM

## 2017-07-20 NOTE — Progress Notes (Signed)
Calorie Count Day 2  Estimated Nutritional Needs:   Kcal:  1200-1400kcal/day   Protein:  66-76g/day   Fluid:  >1.2L/day   Dinner 12/6:  Estimated Total- 690 kcal and 30g protein  Breakfast 12/7:  Estimated total- 440 kcal and 20g protein  Lunch 12/7:  Estimated total- 350 kcal and 23g protein   Supplements:  Magic cup TID with meals, each supplement provides 290 kcal and 9 grams of protein  Total Intake:  1480kcal (100% estimated needs) and 73g protein (100% estimated needs)  Hayley Holidayasey Noreen Mackintosh MS, RD, LDN Pager #[phone]- [phone] After Hours Pager: [phone]

## 2017-07-20 NOTE — Progress Notes (Signed)
ANTIBIOTIC CONSULT NOTE - INITIAL  Pharmacy Consult for piperacillin/tazobactam Indication: UTI  No Known Allergies  Patient Measurements: Height: 5' (152.4 cm) Weight: 112 lb 11.2 oz (51.1 kg) IBW/kg (Calculated) : 45.5  Vital Signs: Temp: 97.8 F (36.6 C) (12/07 0745) Temp Source: Oral (12/07 0416) BP: 111/61 (12/07 0745) Pulse Rate: 70 (12/07 0745) Intake/Output from previous day: 12/06 0701 - 12/07 0700 In: 1952.5 [I.V.:1802.5; IV Piggyback:150] Out: 500 [Urine:500] Intake/Output from this shift: Total I/O In: 120 [P.O.:120] Out: -   Labs: Recent Labs    07/18/17 0527  CREATININE 0.97   Estimated Creatinine Clearance: 27.7 mL/min (by C-G formula based on SCr of 0.97 mg/dL).  Micro: 12/2 Blood: staph simulans 12/2 Urine: enterococcus, E. coli  Assessment: Pharmacy dosing piperacillin/tazobactam for UTI. ID is following. BCID positive for  Staph simulans, repeat blood cultures obtained to determine if contaminant.  Plan:  Continue piperacillin/tazobatam 3.375 g IV q8h EI  Cindi CarbonMary M Abrian Hanover, Pharm.D., BCPS Clinical Pharmacist 07/20/2017,10:57 AM

## 2017-07-20 NOTE — Progress Notes (Signed)
Nutrition Follow Up Note   DOCUMENTATION CODES:   Severe malnutrition in context of chronic illness  INTERVENTION:   Pt at refeeding risk; recommend monitor K, Mg, and P  Magic cup TID with meals, each supplement provides 290 kcal and 9 grams of protein  Honey Thick Mighty Shake TID, each supplement provides 200kcal and 7g protein   NUTRITION DIAGNOSIS:   Severe Malnutrition related to chronic illness(dementia and advanced age ) as evidenced by severe fat depletion, severe muscle depletion.  GOAL:   Patient will meet greater than or equal to 90% of their needs  MONITOR:   PO intake, Supplement acceptance, Labs, I & O's, Weight trends  ASSESSMENT:   83 year old female with a history of dementia presenting with seizure-like activity. Found to have UTI with sepsis   Calorie count complete; pt meeting 100% of her estimated needs with meals and supplements. Wound recommend family continue supplements at home. Magic Cups can be purchased from [facility] at discounted rates by contacting [phone] (ask for [facility]). Pt continues to refeed; spoke to pharmacy who has contacted MD about electrolyte management. Recommend monitor labs for at least 3 days. No new weight since 12/3; recommend obtain new weight.    Medications reviewed and include: aspirin, heparin, zosyn, K Phos  Labs reviewed: K 3.1(L), P 1.9(L), Mg 1.6(L)  Diet Order:  DIET - DYS 1 Room service appropriate? Yes with Assist; Fluid consistency: Honey Thick  EDUCATION NEEDS:   Not appropriate for education at this time  Skin: Reviewed RN Assessment  Last BM:  12/4  Height:   Ht Readings from Last 1 Encounters:  07/16/17 5' (1.524 m)    Weight:   Wt Readings from Last 1 Encounters:  07/16/17 112 lb 11.2 oz (51.1 kg)    Ideal Body Weight:  45.4 kg  BMI:  Body mass index is 22.01 kg/m.  Estimated Nutritional Needs:   Kcal:  1200-1400kcal/day   Protein:  66-76g/day   Fluid:  >1.2L/day    Betsey Holidayasey Jaiya Mooradian MS, RD, LDN Pager #[phone]- [phone] After Hours Pager: [phone]

## 2017-07-20 NOTE — Care Management (Signed)
PCP- Salomon MastBradley Evans [phone]  [facility]

## 2017-07-20 NOTE — Care Management (Addendum)
CM informed that patient to discharge 07/21/2017 .  Grand daughter informed CM that patient will require a hospital bed.  Agency preference is [facility] [place].  If receives all information, will not be able to deliver the bed until Monday or Tuesday of next week. Spoke with daughter Archie Pattenonya.  Delivery date for bed is satisfactory. Agreeable to have home health nurse and social work.  [facility] under service because family wishes to have symptoms assessed and treated- such as these seizures and uti.  Have contacted [facility] to determine if they service the zip code of residence.  Informed family will transport patient home- confirmed by Thurman Coyeronya  [facility] does have a branch that can service [place] and Becky SaxCheryl Rose is coordinating home health referral for SN and SW

## 2017-07-20 NOTE — Care Management (Signed)
Medical Necessity For Hospital Bed Hayley Greene  Patient spends  75 % of the time in bed. Advancing dementia with dysphagia puts patient at constant risk for aspiration. Torso required to be elevated at least 30 degrees  to prevent gagging and choking and decrease risk of aspiration  Bed wedges do not provide adequate elevation to resolve issues with aspiration.  She requires postioning q 2 hours to prevent skin breakdown   Faxed the demographics, order and medical necessity note to 1 [phone]

## 2017-07-20 NOTE — Progress Notes (Signed)
[facility]FECT[facility] PROGRESS NOTE Date of Admission:  07/15/2017     ID: Cecilie LowersLuvenia Grimley is a 83 y.o. female with UTI, AMS Principal Problem:   Sepsis (HCC) Active Problems:   Lower urinary tract infectious disease   Dementia   DNR (do not resuscitate) discussion   Palliative care by specialist   Subjective: No fevers. More alert today, granddaughter says she ate some, was up all night.   ROS  Unable to obtain  Medications:  Antibiotics Given (last 72 hours)    Date/Time Action Medication Dose Rate   07/17/17 1951 New Bag/Given   piperacillin-tazobactam (ZOSYN) IVPB 3.375 g 3.375 g 12.5 mL/hr   07/18/17 0528 New Bag/Given   piperacillin-tazobactam (ZOSYN) IVPB 3.375 g 3.375 g 12.5 mL/hr   07/18/17 1511 New Bag/Given   piperacillin-tazobactam (ZOSYN) IVPB 3.375 g 3.375 g 12.5 mL/hr   07/18/17 2228 New Bag/Given   piperacillin-tazobactam (ZOSYN) IVPB 3.375 g 3.375 g 12.5 mL/hr   07/19/17 0455 New Bag/Given   piperacillin-tazobactam (ZOSYN) IVPB 3.375 g 3.375 g 12.5 mL/hr   07/19/17 1122 New Bag/Given   piperacillin-tazobactam (ZOSYN) IVPB 3.375 g 3.375 g 12.5 mL/hr   07/19/17 2002 New Bag/Given   piperacillin-tazobactam (ZOSYN) IVPB 3.375 g 3.375 g 12.5 mL/hr   07/20/17 0452 New Bag/Given   piperacillin-tazobactam (ZOSYN) IVPB 3.375 g 3.375 g 12.5 mL/hr     . aspirin EC  81 mg Oral Daily  . heparin injection (subcutaneous)  5,000 Units Subcutaneous Q8H  . levETIRAcetam  250 mg Oral BID  . mouth rinse  15 mL Mouth Rinse BID  . risperiDONE  1 mg Oral Daily    Objective: Vital signs in last 24 hours: Temp:  [97.8 F (36.6 C)-98 F (36.7 C)] 97.8 F (36.6 C) (12/07 0745) Pulse Rate:  [53-70] 70 (12/07 0745) Resp:  [12-18] 18 (12/07 0745) BP: (111-155)/(60-79) 111/61 (12/07 0745) SpO2:  [93 %-99 %] 99 % (12/07 0745) Constitutional:  Thin, frail, min responsive. Lying in bed HENT: [place]/AT, PERRLA, no scleral icterus Mouth/Throat: Oropharynx is clear and  dry. No oropharyngeal exudate.  Cardiovascular: Normal rate, regular rhythm and normal heart sounds. Pulmonary/Chest: Effort normal and breath sounds normal. No respiratory distress.  has no wheezes.  Neck = supple, no nuchal rigidity Abdominal: Soft. Bowel sounds are normal.  exhibits no distension. There is no tenderness.  Lymphadenopathy: no cervical adenopathy. No axillary adenopathy Neurological: min responsive Skin: Skin is warm and dry. No rash noted. No erythema.  Psychiatric: unable to obtain    Lab Results Recent Labs    07/18/17 0527 07/20/17 0818  NA 143  --   K 3.1* 3.1*  CL 114*  --   CO2 24  --   BUN 24*  --   CREATININE 0.97  --     Microbiology: Results for orders placed or performed during the hospital encounter of 07/15/17  Blood Culture (routine x 2)     Status: Abnormal   Collection Time: 07/15/17  7:48 PM  Result Value Ref Range Status   Specimen Description BLOOD LEFT UPPER FOREARM  Final   Special Requests   Final    BOTTLES DRAWN AEROBIC AND ANAEROBIC Blood Culture results may not be optimal due to an excessive volume of blood received in culture bottles   Culture  Setup Time   Final    GRAM POSITIVE COCCI IN BOTH AEROBIC AND ANAEROBIC BOTTLES CRITICAL RESULT CALLED TO, READ BACK BY AND VERIFIED WITH: SCOTT CHRISTY ON 07/16/17 AT 2019  [facility]    Culture STAPHYLOCOCCUS SIMULANS (A)  Final   Report Status 07/19/2017 FINAL  Final   Organism ID, Bacteria STAPHYLOCOCCUS SIMULANS  Final      Susceptibility   Staphylococcus simulans - MIC*    CIPROFLOXACIN <=0.5 SENSITIVE Sensitive     ERYTHROMYCIN >=8 RESISTANT Resistant     GENTAMICIN <=0.5 SENSITIVE Sensitive     OXACILLIN <=0.25 SENSITIVE Sensitive     TETRACYCLINE <=1 SENSITIVE Sensitive     VANCOMYCIN <=0.5 SENSITIVE Sensitive     TRIMETH/SULFA <=10 SENSITIVE Sensitive     CLINDAMYCIN RESISTANT Resistant     RIFAMPIN <=0.5 SENSITIVE Sensitive     Inducible Clindamycin POSITIVE Resistant     *  STAPHYLOCOCCUS SIMULANS  Blood Culture (routine x 2)     Status: Abnormal   Collection Time: 07/15/17  8:39 PM  Result Value Ref Range Status   Specimen Description BLOOD LEFT ANTECUBITAL  Final   Special Requests   Final    BOTTLES DRAWN AEROBIC AND ANAEROBIC Blood Culture results may not be optimal due to an excessive volume of blood received in culture bottles   Culture  Setup Time   Final    ANAEROBIC BOTTLE ONLY GRAM POSITIVE COCCI CRITICAL RESULT CALLED TO, READ BACK BY AND VERIFIED WITH: SCOTT CHRISTY ON 07/16/17 AT 2019 [facility] GR[facility] POSITIVE RODS AEROBIC BOTTLE ONLY CRITICAL RESULT CALLED TO, READ BACK BY AND VERIFIED WITH: JASON ROBBINS 07/18/17 @ 1509  MLK    Culture (A)  Final    STAPHYLOCOCCUS SIMULANS SUSCEPTIBILITIES PERFORMED ON PREVIOUS CULTURE WITHIN THE LAST 5 DAYS. DIPHTHEROIDS(CORYNEBACTERIUM SPECIES) Standardized susceptibility testing for this organism is not available. Performed at [facility], [address] [place], [place] [address]    Report Status 07/20/2017 FINAL  Final  Blood Culture ID Panel (Reflexed)     Status: None   Collection Time: 07/15/17  8:39 PM  Result Value Ref Range Status   Enterococcus species NOT DETECTED NOT DETECTED Final   Listeria monocytogenes NOT DETECTED NOT DETECTED Final   Staphylococcus species NOT DETECTED NOT DETECTED Final   Staphylococcus aureus NOT DETECTED NOT DETECTED Final   Streptococcus species NOT DETECTED NOT DETECTED Final   Streptococcus agalactiae NOT DETECTED NOT DETECTED Final   Streptococcus pneumoniae NOT DETECTED NOT DETECTED Final   Streptococcus pyogenes NOT DETECTED NOT DETECTED Final   Acinetobacter baumannii NOT DETECTED NOT DETECTED Final   Enterobacteriaceae species NOT DETECTED NOT DETECTED Final   Enterobacter cloacae complex NOT DETECTED NOT DETECTED Final   Escherichia coli NOT DETECTED NOT DETECTED Final   Klebsiella oxytoca NOT DETECTED NOT DETECTED Final   Klebsiella pneumoniae NOT  DETECTED NOT DETECTED Final   Proteus species NOT DETECTED NOT DETECTED Final   Serratia marcescens NOT DETECTED NOT DETECTED Final   Haemophilus influenzae NOT DETECTED NOT DETECTED Final   Neisseria meningitidis NOT DETECTED NOT DETECTED Final   Pseudomonas aeruginosa NOT DETECTED NOT DETECTED Final   Candida albicans NOT DETECTED NOT DETECTED Final   Candida glabrata NOT DETECTED NOT DETECTED Final   Candida krusei NOT DETECTED NOT DETECTED Final   Candida parapsilosis NOT DETECTED NOT DETECTED Final   Candida tropicalis NOT DETECTED NOT DETECTED Final  Blood Culture ID Panel (Reflexed)     Status: None   Collection Time: 07/15/17  8:39 PM  Result Value Ref Range Status   Enterococcus species NOT DETECTED NOT DETECTED Final   Listeria monocytogenes NOT DETECTED NOT DETECTED Final   Staphylococcus species NOT DETECTED  NOT DETECTED Final   Staphylococcus aureus NOT DETECTED NOT DETECTED Final   Streptococcus species NOT DETECTED NOT DETECTED Final   Streptococcus agalactiae NOT DETECTED NOT DETECTED Final   Streptococcus pneumoniae NOT DETECTED NOT DETECTED Final   Streptococcus pyogenes NOT DETECTED NOT DETECTED Final   Acinetobacter baumannii NOT DETECTED NOT DETECTED Final   Enterobacteriaceae species NOT DETECTED NOT DETECTED Final   Enterobacter cloacae complex NOT DETECTED NOT DETECTED Final   Escherichia coli NOT DETECTED NOT DETECTED Final   Klebsiella oxytoca NOT DETECTED NOT DETECTED Final   Klebsiella pneumoniae NOT DETECTED NOT DETECTED Final   Proteus species NOT DETECTED NOT DETECTED Final   Serratia marcescens NOT DETECTED NOT DETECTED Final   Haemophilus influenzae NOT DETECTED NOT DETECTED Final   Neisseria meningitidis NOT DETECTED NOT DETECTED Final   Pseudomonas aeruginosa NOT DETECTED NOT DETECTED Final   Candida albicans NOT DETECTED NOT DETECTED Final   Candida glabrata NOT DETECTED NOT DETECTED Final   Candida krusei NOT DETECTED NOT DETECTED Final    Candida parapsilosis NOT DETECTED NOT DETECTED Final   Candida tropicalis NOT DETECTED NOT DETECTED Final  Urine culture     Status: Abnormal   Collection Time: 07/15/17  9:52 PM  Result Value Ref Range Status   Specimen Description URINE, RANDOM  Final   Special Requests NONE  Final   Culture (A)  Final    >=100,000 COLONIES/mL ESCHERICHIA COLI >=100,000 COLONIES/mL ENTEROCOCCUS FAECALIS    Report Status 07/18/2017 FINAL  Final   Organism ID, Bacteria ESCHERICHIA COLI (A)  Final   Organism ID, Bacteria ENTEROCOCCUS FAECALIS (A)  Final      Susceptibility   Escherichia coli - MIC*    AMPICILLIN >=32 RESISTANT Resistant     CEFAZOLIN <=4 SENSITIVE Sensitive     CEFTRIAXONE <=1 SENSITIVE Sensitive     CIPROFLOXACIN >=4 RESISTANT Resistant     GENTAMICIN <=1 SENSITIVE Sensitive     IMIPENEM <=0.25 SENSITIVE Sensitive     NITROFURANTOIN <=16 SENSITIVE Sensitive     TRIMETH/SULFA >=320 RESISTANT Resistant     AMPICILLIN/SULBACTAM >=32 RESISTANT Resistant     PIP/TAZO <=4 SENSITIVE Sensitive     Extended ESBL NEGATIVE Sensitive     * >=100,000 COLONIES/mL ESCHERICHIA COLI   Enterococcus faecalis - MIC*    AMPICILLIN <=2 SENSITIVE Sensitive     LEVOFLOXACIN 2 SENSITIVE Sensitive     NITROFURANTOIN <=16 SENSITIVE Sensitive     VANCOMYCIN 1 SENSITIVE Sensitive     * >=100,000 COLONIES/mL ENTEROCOCCUS FAECALIS  Culture, blood (single) w Reflex to ID Panel     Status: None (Preliminary result)   Collection Time: 07/19/17  5:16 PM  Result Value Ref Range Status   Specimen Description BLOOD LEFT ANTECUBITAL  Final   Special Requests   Final    BOTTLES DRAWN AEROBIC AND ANAEROBIC Blood Culture adequate volume   Culture NO GROWTH < 24 HOURS  Final   Report Status PENDING  Incomplete    Studies/Results: No results found.  Assessment/Plan: Ambyr Qadri is a 83 y.o. female with advancing dementia admitted with AMS and seizure like activity. She has a hx of recurrent UTIs and on admit  had tntc wbc and ucx with E coli and Enterococcus.  Blood culture with Staph simulans which is a member of the coag neg staph family and is likely contaminant.  Day 6 of zosyn. Family reports she has waxing and waning mental status but has a general decline over the last several months.  Repeat bcx ngtd Recommendations Continue zosyn for now but at dc can change to oral abx with amoxicillin 500 bid  and keflex 500 bid for a total abx duration of 10 days (from admission date of 12/2 =stop date 12/12 Would try to give liquid formulation of these if possible.    Thank you very much for the consult. Will follow with you.  Mick Sell   07/20/2017, 2:23 PM

## 2017-07-20 NOTE — Progress Notes (Signed)
[facility] -  at  [facility]   PATIENT NAME: Hayley Greene    MR#:  [id number]  DATE OF BIRTH:  06/20/1925  SUBJECTIVE:   Mental status much improved since admission. Patient is more awake and is tolerating any oral dysphagia 1 diet with thickened liquids. Seen by Infectious disease and repeat BC obtained which are (-) so far.    REVIEW OF SYSTEMS:    Review of Systems  Unable to perform ROS: Mental acuity    Nutrition: Dysphagia 1 with honey thick Tolerating Diet: yes  DRUG ALLERGIES:  No Known Allergies  VITALS:  Blood pressure 111/61, pulse 70, temperature 97.8 F (36.6 C), resp. rate 18, height 5' (1.524 m), weight 51.1 kg (112 lb 11.2 oz), SpO2 99 %.  PHYSICAL EXAMINATION:   Physical Exam  GENERAL:  83 y.o.-year-old patient lying in bed awake but in NAD EYES: Pupils equal, round, reactive to light. No scleral icterus.  HEENT: Head atraumatic, normocephalic. Oropharynx and nasopharynx clear. Dry Oral mucosa.  NECK:  Supple, no jugular venous distention. No thyroid enlargement, no tenderness.  LUNGS: Normal breath sounds bilaterally, no wheezing, rales, rhonchi. No use of accessory muscles of respiration.  CARDIOVASCULAR: S1, S2 normal. No murmurs, rubs, or gallops.  ABDOMEN: Soft, nontender, nondistended. Bowel sounds present. No organomegaly or mass.  EXTREMITIES: No cyanosis, clubbing or edema b/l.    NEUROLOGIC: CN II-XII intact, no focal motor or sensory deficits appreciated bilaterally. Globally weak. PSYCHIATRIC: The patient is alert and oriented x 1.  SKIN: No obvious rash, lesion, or ulcer.    LABORATORY PANEL:   CBC Recent Labs  Lab 07/17/17 0344  WBC 3.4*  HGB 8.7*  HCT 26.3*  PLT 108*   ------------------------------------------------------------------------------------------------------------------  Chemistries  Recent Labs  Lab 07/15/17 1948  07/18/17 0527  07/20/17 0818  NA 139   < > 143  --   --   K 3.7   < >  3.1*  --  3.1*  CL 100*   < > 114*  --   --   CO2 29   < > 24  --   --   GLUCOSE 121*   < > 67  --   --   BUN 39*   < > 24*  --   --   CREATININE 1.04*   < > 0.97  --   --   CALCIUM 9.0   < > 8.1*  --   --   MG  --    < > 1.8   < > 1.6*  AST 34  --   --   --   --   ALT 19  --   --   --   --   ALKPHOS 58  --   --   --   --   BILITOT 0.5  --   --   --   --    < > = values in this interval not displayed.   ------------------------------------------------------------------------------------------------------------------  Cardiac Enzymes Recent Labs  Lab 07/15/17 1948  TROPONINI <0.03   ------------------------------------------------------------------------------------------------------------------  RADIOLOGY:  No results found.   ASSESSMENT AND PLAN:   83-year-old female with advanced dementia was followed by hospice services at home presents to the hospital due to altered mental status and suspected to have a seizure.  1. Altered mental status-suspected to be a multifactorial source. -Secondary to underlying dementia combined with seizure with postictal state, and also urinary tract infection. -CT head, MRI of the brain negative for   acute stroke.  -Urine cultures are positive for enterococcus and Escherichia coli. On IV Zosyn and much improved and back to baseline.   2. Suspected seizures-patient noted to have seizure type activity and then developed postictal state and was incontinent of urine as per the granddaughter on admission.  No further seizures while in hospital.  - Appreciate neurology input and cont. Low dose Keppra. EEG was negative for seizure type activity.  3. Urinary tract infection-patient's urine culture was positive for enterococcus and Escherichia coli. -Continue IV Zosyn and as per infectious disease consults which to oral amoxicillin and Keflex for a total of 10 days duration upon discharge.  4. Sepsis-patient met criteria given her hypothermia,  hypotension also abnormal urinalysis on admission.  -Much improved and presently afebrile and hemodynamically stable. Urine cultures positive for enterococcus and Escherichia coli and continue antibiotic regimen as mentioned above.  5. Bacteremia - pt. BC are + Staph Simulans.  ?? Source or significance of this. Seen by infectious disease and they think this is a contaminant. Repeat blood cultures obtained yesterday which were negative presently. Patient remains hemodynamically stable and afebrile.   All the records are reviewed and case discussed with Care Management/Social Worker. Management plans discussed with the patient, family and they are in agreement.  CODE STATUS: Full code  DVT Prophylaxis: Lovenox  TOTAL TIME TAKING CARE OF THIS PATIENT: 30 minutes.   POSSIBLE D/C IN 1-2 DAYS, DEPENDING ON CLINICAL CONDITION.   , J M.D on 07/20/2017 at 2:07 PM  Between 7am to 6pm - Pager - [phone]  After 6pm go to [email or web address] - password [email or web address]  [facility] [place] [facility]  Office  [phone]  CC: Primary care physician; Hospital, [facility]  

## 2017-07-21 LAB — PHOSPHORUS: Phosphorus: 1.9 mg/dL — ABNORMAL LOW (ref 2.5–4.6)

## 2017-07-21 LAB — BASIC METABOLIC PANEL
Anion gap: 3 — ABNORMAL LOW (ref 5–15)
BUN: 11 mg/dL (ref 6–20)
CO2: 27 mmol/L (ref 22–32)
Calcium: 7.7 mg/dL — ABNORMAL LOW (ref 8.9–10.3)
Chloride: 116 mmol/L — ABNORMAL HIGH (ref 101–111)
Creatinine, Ser: 0.68 mg/dL (ref 0.44–1.00)
GFR calc Af Amer: >60 mL/min (ref >60)
GLUCOSE: 101 mg/dL — AB (ref 65–99)
POTASSIUM: 3.2 mmol/L — AB (ref 3.5–5.1)
Sodium: 146 mmol/L — ABNORMAL HIGH (ref 135–145)

## 2017-07-21 LAB — MAGNESIUM: Magnesium: 1.7 mg/dL (ref 1.7–2.4)

## 2017-07-21 MED ORDER — DEXTROSE 5 % IV SOLN
20.0000 meq | INTRAVENOUS | Status: AC
  Administered 2017-07-21: 20 meq via INTRAVENOUS
  Filled 2017-07-21: qty 4.55

## 2017-07-21 MED ORDER — AMOXICILLIN 250 MG/5ML PO SUSR
250.0000 mg | Freq: Two times a day (BID) | ORAL | Status: DC
  Filled 2017-07-21: qty 5

## 2017-07-21 MED ORDER — LEVETIRACETAM NICU ORAL SYRINGE 100 MG/ML: 250.0000 mg | Freq: Two times a day (BID) | ORAL | 0 refills | Status: AC

## 2017-07-21 MED ORDER — CEPHALEXIN 250 MG/5ML PO SUSR
250.0000 mg | Freq: Two times a day (BID) | ORAL | Status: DC
  Filled 2017-07-21: qty 5

## 2017-07-21 MED ORDER — CEPHALEXIN 250 MG/5ML PO SUSR: 250.0000 mg | Freq: Two times a day (BID) | ORAL | 0 refills | Status: AC

## 2017-07-21 MED ORDER — AMOXICILLIN 250 MG/5ML PO SUSR: 250.0000 mg | Freq: Two times a day (BID) | ORAL | 0 refills | Status: DC

## 2017-07-21 MED ORDER — AMOXICILLIN 250 MG/5ML PO SUSR: 250.0000 mg | Freq: Two times a day (BID) | ORAL | 0 refills | Status: AC

## 2017-07-21 NOTE — Plan of Care (Signed)
Patient has positive blood cultures and compromised skin. Promote nutrition, q2h turns, and apply barrier cream as needed.

## 2017-07-21 NOTE — Progress Notes (Signed)
Pt has not had anymore complaints about the potassium. Will continue to monitor.

## 2017-07-21 NOTE — Progress Notes (Signed)
Per pharmacy, increase Zosyn rate to 2350ml/hr. Pt has scheduled potassium phosphate at 9am with only 1 IV. So per pharmacy, after zosyn is done, can hang the potassium phosphate.

## 2017-07-21 NOTE — Progress Notes (Signed)
  Speech Language Pathology Treatment: Dysphagia  Patient Details Name: Cecilie LowersLuvenia Danser MRN: [id number] DOB: 05-27-1925 Today's Date: 07/21/2017 Time: 1015-1050 SLP Time Calculation (min) (ACUTE ONLY): 35 min  Assessment / Plan / Recommendation Clinical Impression  Pt seen for ongoing toleration of diet and appropriateness for diet consistency upgrade. Pt has been tolerating the current dysphagia diet of level 1 w/ Honey consistency liquids w/ no decline in status during intake. Suspect pt's baseline Cognitive decline and ongoing confusion(muttered/mumbled speech; inability to follow directions) are impacting her follow through, and even desire for, much po intake - family indicated pt was not eating much at times at home "during times when she was not herself". Pt is total care at home for ADLs.  Pt was alert, muttered speech. She tolerated TSP trials of Honey consistency liquids but required redirection to task and encouragement. No overt s/s of aspiration were noted w/ the few TSP trials; oral phase adequate for oral clearing given time and intermittent verbal cues.  Due to pt's declined mental and Cognitive status', previous baseline of dysphagia per family report, and increased risk  for aspiration currently, recommend continue w/ current dysphagia level 1(puree) diet w/ Honey consistency liquids via TSP; strict aspiration precautions and monitoring during feeding of po's. Frequent oral care. HH SLP can be consulted to follow pt for assessment of diet consistency upgrade if appropriate after pt returns home and medical/Cognitive status' improve. Recommend family f/u w/ pt's PCP for such order. Handouts on aspiration precautions; dysphagia and dysphagia products/ordering info, thickened liquids and can of thickener were left in room for discharge home. NSG updated and will f/u w/ family as well when they arrive to take pt home.     HPI HPI: Pt is a 83 year old female with advanced dementia was  followed by hospice services at home presents to the hospital due to altered mental status and suspected to have a seizure. Per MD note, Secondary to underlying dementia combined with suspected seizure with postictal state, and also urinary tract infection. Per family member in the room, patient is close to baseline other than some swallowing issues that have not resolved.  It should be noted though that with further questioning she often has swallowing issues but they just clear faster(per MD note). Granddaughter stated pt "holds the food in her mouth at home" but given time and verbal encouragement, she swallows and clears.       SLP Plan  Continue with current plan of care       Recommendations  Diet recommendations: Dysphagia 1 (puree);Honey-thick liquid(HH SLP can assess for diet upgrade appropriateness) Liquids provided via: Teaspoon Medication Administration: Crushed with puree Supervision: Full supervision/cueing for compensatory strategies;Trained caregiver to feed patient Compensations: Minimize environmental distractions;Slow rate;Small sips/bites;Lingual sweep for clearance of pocketing;Multiple dry swallows after each bite/sip;Follow solids with liquid(verbal cues to swallow; check oral clearing) Postural Changes and/or Swallow Maneuvers: Seated upright 90 degrees;Upright 30-60 min after meal                General recommendations: (Dietician f/u) Oral Care Recommendations: Oral care BID;Staff/trained caregiver to provide oral care Follow up Recommendations: Home health SLP(as d/c plan is to home per chart notes) SLP Visit Diagnosis: Dysphagia, oropharyngeal phase (R13.12);Dysphagia, pharyngeal phase (R13.13) Plan: Continue with current plan of care       GO                Jerilynn SomKatherine Rojelio Uhrich, MS, CCC-SLP Thomasa Heidler 07/21/2017, 10:52 AM

## 2017-07-21 NOTE — Progress Notes (Signed)
Discharge instructions given with prescriptions. Family verbalized understanding with no further questions.

## 2017-07-21 NOTE — Discharge Summary (Signed)
[facility] - [place] [facility]   PATIENT NAME: Hayley Greene    MR#:  [id number]  DATE OF BIRTH:  May 20, 1925  DATE OF ADMISSION:  07/15/2017 ADMITTING PHYSICIAN: Hayley Manisavid Willis, MD  DATE OF DISCHARGE: 07/21/2017  PRIMARY CARE PHYSICIAN: Dr Hayley Greene   ADMISSION DIAGNOSIS:  Lower urinary tract infectious disease [N39.0] Confusion [R41.0] Sepsis, due to unspecified organism (HCC) [A41.9] Dementia without behavioral disturbance, unspecified dementia type [F03.90] Sepsis (HCC) [A41.9]  DISCHARGE DIAGNOSIS:  Principal Problem:   Sepsis (HCC) Active Problems:   Lower urinary tract infectious disease   Dementia   DNR (do not resuscitate) discussion   Palliative care by specialist   SECONDARY DIAGNOSIS:   Past Medical History:  Diagnosis Date  . Alzheimer disease   . Anxiety     HOSPITAL COURSE:   1.  Acute encephalopathy.  As per the granddaughter, this has been the worst case.  She noticed violent shaking as outpatient.  Patient was placed on Keppra for suspected seizure.  Patient also had positive urinary infection which could be contributing.  The patient also has dementia which can be progressive.  Patient is high risk for aspiration and is on dysphagia 1 with honey thickened liquids.  Patient also high risk for rehospitalization because she is high risk for dehydration.   2.  Seizures.  Patient empirically put on Keppra.  MRI of the brain negative and EEG negative for seizure activity. 3.  Clinical sepsis with acute cystitis with hematuria.  The patient was placed on Zosyn while here.  Switch over to amoxicillin and Keppra as per infectious disease for another 5 days. 4.  Blood cultures growing staph simulans which is a skin contamination.  No treatment as per infectious disease. 5.  Dementia.  On Risperdal at home. 6.  Hypophosphatemia, hypokalemia and hypomagnesemia.  These have been replaced during the hospital course. 7.  Pancytopenia.  This looks  chronic for the patient dating back to May 2018.  Overall prognosis is poor.  Patient was previously followed by hospice as outpatient.  Family states state they do want to treat her if she has an issue rather than comfort measures at this point.  High risk for rehospitalization.  DISCHARGE CONDITIONS:   Guarded  CONSULTS OBTAINED:  Treatment Team:  Hayley Farreynolds, Leslie, MD Hayley Greene, Hayley P, MD  DRUG ALLERGIES:  No Known Allergies  DISCHARGE MEDICATIONS:   Allergies as of 07/21/2017   No Known Allergies     Medication List    TAKE these medications   amoxicillin 250 MG/5ML suspension Commonly known as:  AMOXIL Take 5 mLs (250 mg total) by mouth every 12 (twelve) hours for 5 days.   cephALEXin 250 MG/5ML suspension Commonly known as:  KEFLEX Take 5 mLs (250 mg total) by mouth every 12 (twelve) hours.   levETIRAcetam 100 MG/ML Soln Commonly known as:  KEPPRA Take 2.5 mLs (250 mg total) by mouth 2 (two) times daily.   risperiDONE 0.5 MG tablet Commonly known as:  RISPERDAL Take 1 mg by mouth daily.            Durable Medical Equipment  (From admission, onward)        Start     Ordered   07/20/17 1504  For home use only DME Hospital bed  Once    Question Answer Comment  The above medical condition requires: Patient requires the ability to reposition frequently   Head must be elevated greater than: 30 degrees   Bed type  Semi-electric      07/20/17 1503       DISCHARGE INSTRUCTIONS:   Follow-up PMD 1 week  If you experience worsening of your admission symptoms, develop shortness of breath, life threatening emergency, suicidal or homicidal thoughts you must seek medical attention immediately by calling 911 or calling your MD immediately  if symptoms less severe.  You Must read complete instructions/literature along with all the possible adverse reactions/side effects for all the Medicines you take and that have been prescribed to you. Take any new Medicines  after you have completely understood and accept all the possible adverse reactions/side effects.   Please note  You were cared for by a hospitalist during your hospital stay. If you have any questions about your discharge medications or the care you received while you were in the hospital after you are discharged, you can call the unit and asked to speak with the hospitalist on call if the hospitalist that took care of you is not available. Once you are discharged, your primary care physician will handle any further medical issues. Please note that NO REFILLS for any discharge medications will be authorized once you are discharged, as it is imperative that you return to your primary care physician (or establish a relationship with a primary care physician if you do not have one) for your aftercare needs so that they can reassess your need for medications and monitor your lab values.    Today   CHIEF COMPLAINT:   Chief Complaint  Patient presents with  . Altered Mental Status    HISTORY OF PRESENT ILLNESS:  [name]  is a 83 y.o. female with a known history of dementia presented with altered mental status and found to have a urinary tract infection and suspected seizure.   VITAL SIGNS:  Blood pressure (!) 140/57, pulse 74, temperature 98.1 F (36.7 C), temperature source Oral, resp. rate 16, height 5' (1.524 m), weight 51.1 kg (112 lb 11.2 oz), SpO2 97 %.   PHYSICAL EXAMINATION:  GENERAL:  83 y.o.-year-old patient lying in the bed with no acute distress.  EYES: Pupils equal, round, reactive to light and accommodation. No scleral icterus. HEENT: Head atraumatic, normocephalic. Oropharynx and nasopharynx clear.  NECK:  Supple, no jugular venous distention. No thyroid enlargement, no tenderness.  LUNGS: Normal breath sounds bilaterally, no wheezing, rales,rhonchi or crepitation. No use of accessory muscles of respiration.  CARDIOVASCULAR: S1, S2 normal. No murmurs, rubs, or gallops.   ABDOMEN: Soft, non-tender, non-distended. Bowel sounds present. No organomegaly or mass.  EXTREMITIES: Trace edema, no cyanosis, or clubbing.  NEUROLOGIC: Patient does try to answer questions.  But difficult to understand PSYCHIATRIC: The patient is alert. SKIN: No obvious rash, lesion, or ulcer.   DATA REVIEW:   CBC Recent Labs  Lab 07/17/17 0344  WBC 3.4*  HGB 8.7*  HCT 26.3*  PLT 108*    Chemistries  Recent Labs  Lab 07/15/17 1948  07/21/17 0703  NA 139   < > 146*  K 3.7   < > 3.2*  CL 100*   < > 116*  CO2 29   < > 27  GLUCOSE 121*   < > 101*  BUN 39*   < > 11  CREATININE 1.04*   < > 0.68  CALCIUM 9.0   < > 7.7*  MG  --    < > 1.7  AST 34  --   --   ALT 19  --   --   ALKPHOS  58  --   --   BILITOT 0.5  --   --    < > = values in this interval not displayed.    Cardiac Enzymes Recent Labs  Lab 07/15/17 1948  TROPONINI <0.03    Microbiology Results  Results for orders placed or performed during the hospital encounter of 07/15/17  Blood Culture (routine x 2)     Status: Abnormal   Collection Time: 07/15/17  7:48 PM  Result Value Ref Range Status   Specimen Description BLOOD LEFT UPPER FOREARM  Final   Special Requests   Final    BOTTLES DRAWN AEROBIC AND ANAEROBIC Blood Culture results may not be optimal due to an excessive volume of blood received in culture bottles   Culture  Setup Time   Final    GRAM POSITIVE COCCI IN BOTH AEROBIC AND ANAEROBIC BOTTLES CRITICAL RESULT CALLED TO, READ BACK BY AND VERIFIED WITH: SCOTT CHRISTY ON 07/16/17 AT 2019 [facility]    Culture STAPHYLOCOCCUS SIMULANS (A)  Final   Report Status 07/19/2017 FINAL  Final   Organism ID, Bacteria STAPHYLOCOCCUS SIMULANS  Final      Susceptibility   Staphylococcus simulans - MIC*    CIPROFLOXACIN <=0.5 SENSITIVE Sensitive     ERYTHROMYCIN >=8 RESISTANT Resistant     GENTAMICIN <=0.5 SENSITIVE Sensitive     OXACILLIN <=0.25 SENSITIVE Sensitive     TETRACYCLINE <=1 SENSITIVE Sensitive      VANCOMYCIN <=0.5 SENSITIVE Sensitive     TRIMETH/SULFA <=10 SENSITIVE Sensitive     CLINDAMYCIN RESISTANT Resistant     RIFAMPIN <=0.5 SENSITIVE Sensitive     Inducible Clindamycin POSITIVE Resistant     * STAPHYLOCOCCUS SIMULANS  Blood Culture (routine x 2)     Status: Abnormal   Collection Time: 07/15/17  8:39 PM  Result Value Ref Range Status   Specimen Description BLOOD LEFT ANTECUBITAL  Final   Special Requests   Final    BOTTLES DRAWN AEROBIC AND ANAEROBIC Blood Culture results may not be optimal due to an excessive volume of blood received in culture bottles   Culture  Setup Time   Final    ANAEROBIC BOTTLE ONLY GRAM POSITIVE COCCI CRITICAL RESULT CALLED TO, READ BACK BY AND VERIFIED WITH: SCOTT CHRISTY ON 07/16/17 AT 2019 [facility] GRAM POSITIVE RODS AEROBIC BOTTLE ONLY CRITICAL RESULT CALLED TO, READ BACK BY AND VERIFIED WITH: JASON ROBBINS 07/18/17 @ 1509  MLK    Culture (A)  Final    STAPHYLOCOCCUS SIMULANS SUSCEPTIBILITIES PERFORMED ON PREVIOUS CULTURE WITHIN THE LAST 5 DAYS. DIPHTHEROIDS(CORYNEBACTERIUM SPECIES) Standardized susceptibility testing for this organism is not available. Performed at [facility], [address] [place], [place] [address]    Report Status 07/20/2017 FINAL  Final  Blood Culture ID Panel (Reflexed)     Status: None   Collection Time: 07/15/17  8:39 PM  Result Value Ref Range Status   Enterococcus species NOT DETECTED NOT DETECTED Final   Listeria monocytogenes NOT DETECTED NOT DETECTED Final   Staphylococcus species NOT DETECTED NOT DETECTED Final   Staphylococcus aureus NOT DETECTED NOT DETECTED Final   Streptococcus species NOT DETECTED NOT DETECTED Final   Streptococcus agalactiae NOT DETECTED NOT DETECTED Final   Streptococcus pneumoniae NOT DETECTED NOT DETECTED Final   Streptococcus pyogenes NOT DETECTED NOT DETECTED Final   Acinetobacter baumannii NOT DETECTED NOT DETECTED Final   Enterobacteriaceae species NOT DETECTED NOT  DETECTED Final   Enterobacter cloacae complex NOT DETECTED NOT DETECTED Final  Escherichia coli NOT DETECTED NOT DETECTED Final   Klebsiella oxytoca NOT DETECTED NOT DETECTED Final   Klebsiella pneumoniae NOT DETECTED NOT DETECTED Final   Proteus species NOT DETECTED NOT DETECTED Final   Serratia marcescens NOT DETECTED NOT DETECTED Final   Haemophilus influenzae NOT DETECTED NOT DETECTED Final   Neisseria meningitidis NOT DETECTED NOT DETECTED Final   Pseudomonas aeruginosa NOT DETECTED NOT DETECTED Final   Candida albicans NOT DETECTED NOT DETECTED Final   Candida glabrata NOT DETECTED NOT DETECTED Final   Candida krusei NOT DETECTED NOT DETECTED Final   Candida parapsilosis NOT DETECTED NOT DETECTED Final   Candida tropicalis NOT DETECTED NOT DETECTED Final  Blood Culture ID Panel (Reflexed)     Status: None   Collection Time: 07/15/17  8:39 PM  Result Value Ref Range Status   Enterococcus species NOT DETECTED NOT DETECTED Final   Listeria monocytogenes NOT DETECTED NOT DETECTED Final   Staphylococcus species NOT DETECTED NOT DETECTED Final   Staphylococcus aureus NOT DETECTED NOT DETECTED Final   Streptococcus species NOT DETECTED NOT DETECTED Final   Streptococcus agalactiae NOT DETECTED NOT DETECTED Final   Streptococcus pneumoniae NOT DETECTED NOT DETECTED Final   Streptococcus pyogenes NOT DETECTED NOT DETECTED Final   Acinetobacter baumannii NOT DETECTED NOT DETECTED Final   Enterobacteriaceae species NOT DETECTED NOT DETECTED Final   Enterobacter cloacae complex NOT DETECTED NOT DETECTED Final   Escherichia coli NOT DETECTED NOT DETECTED Final   Klebsiella oxytoca NOT DETECTED NOT DETECTED Final   Klebsiella pneumoniae NOT DETECTED NOT DETECTED Final   Proteus species NOT DETECTED NOT DETECTED Final   Serratia marcescens NOT DETECTED NOT DETECTED Final   Haemophilus influenzae NOT DETECTED NOT DETECTED Final   Neisseria meningitidis NOT DETECTED NOT DETECTED Final    Pseudomonas aeruginosa NOT DETECTED NOT DETECTED Final   Candida albicans NOT DETECTED NOT DETECTED Final   Candida glabrata NOT DETECTED NOT DETECTED Final   Candida krusei NOT DETECTED NOT DETECTED Final   Candida parapsilosis NOT DETECTED NOT DETECTED Final   Candida tropicalis NOT DETECTED NOT DETECTED Final  Urine culture     Status: Abnormal   Collection Time: 07/15/17  9:52 PM  Result Value Ref Range Status   Specimen Description URINE, RANDOM  Final   Special Requests NONE  Final   Culture (A)  Final    >=100,000 COLONIES/mL ESCHERICHIA COLI >=100,000 COLONIES/mL ENTEROCOCCUS FAECALIS    Report Status 07/18/2017 FINAL  Final   Organism ID, Bacteria ESCHERICHIA COLI (A)  Final   Organism ID, Bacteria ENTEROCOCCUS FAECALIS (A)  Final      Susceptibility   Escherichia coli - MIC*    AMPICILLIN >=32 RESISTANT Resistant     CEFAZOLIN <=4 SENSITIVE Sensitive     CEFTRIAXONE <=1 SENSITIVE Sensitive     CIPROFLOXACIN >=4 RESISTANT Resistant     GENTAMICIN <=1 SENSITIVE Sensitive     IMIPENEM <=0.25 SENSITIVE Sensitive     NITROFURANTOIN <=16 SENSITIVE Sensitive     TRIMETH/SULFA >=320 RESISTANT Resistant     AMPICILLIN/SULBACTAM >=32 RESISTANT Resistant     PIP/TAZO <=4 SENSITIVE Sensitive     Extended ESBL NEGATIVE Sensitive     * >=100,000 COLONIES/mL ESCHERICHIA COLI   Enterococcus faecalis - MIC*    AMPICILLIN <=2 SENSITIVE Sensitive     LEVOFLOXACIN 2 SENSITIVE Sensitive     NITROFURANTOIN <=16 SENSITIVE Sensitive     VANCOMYCIN 1 SENSITIVE Sensitive     * >=100,000 COLONIES/mL ENTEROCOCCUS FAECALIS  Culture, blood (  single) w Reflex to ID Panel     Status: None (Preliminary result)   Collection Time: 07/19/17  5:16 PM  Result Value Ref Range Status   Specimen Description BLOOD LEFT ANTECUBITAL  Final   Special Requests   Final    BOTTLES DRAWN AEROBIC AND ANAEROBIC Blood Culture adequate volume   Culture NO GROWTH 2 DAYS  Final   Report Status PENDING  Incomplete       Management plans discussed with the patient, family and they are in agreement.  CODE STATUS:     Code Status Orders  (From admission, onward)        Start     Ordered   07/16/17 0441  Full code  Continuous     07/16/17 0440    Code Status History    Date Active Date Inactive Code Status Order ID Comments User Context   This patient has a current code status but no historical code status.      TOTAL TIME TAKING CARE OF THIS PATIENT: 38 minutes.    Alford Highland M.D on 07/21/2017 at 1:40 PM  Between 7am to 6pm - Pager - [phone]  After 6pm go to [email or web address] - password [email or web address]  [facility] Office  [phone]  CC: Primary care physician; Dr Hayley Mast

## 2017-07-21 NOTE — Plan of Care (Signed)
  Adequate for Discharge Education: Knowledge of General Education information will improve 07/21/2017 1441 - Adequate for Discharge by Erma HeritageAlejo Calderon, Mina Babula, RN Clinical Measurements: Ability to maintain clinical measurements within normal limits will improve 07/21/2017 1441 - Adequate for Discharge by Weldon PickingAlejo Calderon, Manuella GhaziBerenice, RN Will remain free from infection 07/21/2017 1441 - Adequate for Discharge by Erma HeritageAlejo Calderon, Samual Beals, RN Diagnostic test results will improve 07/21/2017 1441 - Adequate for Discharge by Erma HeritageAlejo Calderon, Fitzhugh Vizcarrondo, RN Respiratory complications will improve 07/21/2017 1441 - Adequate for Discharge by Erma HeritageAlejo Calderon, Burney Calzadilla, RN Cardiovascular complication will be avoided 07/21/2017 1441 - Adequate for Discharge by Erma HeritageAlejo Calderon, Joesphine Schemm, RN Activity: Risk for activity intolerance will decrease 07/21/2017 1441 - Adequate for Discharge by Erma HeritageAlejo Calderon, Stephanne Greeley, RN Nutrition: Adequate nutrition will be maintained 07/21/2017 1441 - Adequate for Discharge by Erma HeritageAlejo Calderon, Esparanza Krider, RN Elimination: Will not experience complications related to bowel motility 07/21/2017 1441 - Adequate for Discharge by Weldon PickingAlejo Calderon, Manuella GhaziBerenice, RN Will not experience complications related to urinary retention 07/21/2017 1441 - Adequate for Discharge by Erma HeritageAlejo Calderon, Merdith Adan, RN Safety: Ability to remain free from injury will improve 07/21/2017 1441 - Adequate for Discharge by Erma HeritageAlejo Calderon, Natoya Viscomi, RN Skin Integrity: Risk for impaired skin integrity will decrease 07/21/2017 1441 - Adequate for Discharge by Erma HeritageAlejo Calderon, Abbagail Scaff, RN Clinical Measurements: Signs and symptoms of infection will decrease 07/21/2017 1441 - Adequate for Discharge by Erma HeritageAlejo Calderon, Aster Screws, RN

## 2017-07-21 NOTE — Progress Notes (Signed)
Reduced pt's potassium phosphate to 5330ml/hr due to patient ""messing with IV site" per family. Pt hx of dementia so can not tell if it burns.

## 2017-07-21 NOTE — Care Management Note (Signed)
Case Management Note  Patient Details  Name: Hayley Greene MRN: [id number] Date of Birth: [birth year]  Subjective/Objective:   [place]. Hospital bed delivery on Monday or Tuesday of next week [facility] [place] [place]. HH=RN, Aide, SW has already been set up with [facility]. [facility] today.                  Action/Plan:   Expected Discharge Date:  07/21/17               Expected Discharge Plan:  Home w Home Health Services  In-House Referral:     Discharge planning Services  CM Consult  Post Acute Care Choice:  Durable Medical Equipment, Home Health Choice offered to:  Adult Children  DME Arranged:  Hospital bed DME Agency:  ([facility] [place], [place])  HH Arranged:  RN, Nurse's Aide, Social Work [employer] Agency:  [facility]  Status of Service:  Completed, signed off  If discussed at [employer] Meetings, dates discussed:    Additional Comments:  Adanna Zuckerman A, RN 07/21/2017, 9:19 AM

## 2017-07-23 NOTE — Care Management (Signed)
Dementia F03.90   07/15/2017 Yes   Entered by Oralia ManisWillis, David, MD

## 2017-07-24 LAB — CULTURE, BLOOD (SINGLE)
CULTURE: NO GROWTH
Special Requests: ADEQUATE

## 2017-09-14 DEATH — deceased

## 2019-02-02 IMAGING — DX DG CHEST 1V PORT
1 series · 1 of 1 positions shown · non-contrast
Comparison: None.

CLINICAL DATA: Altered mental status and weakness today.

EXAM:
PORTABLE CHEST 1 VIEW

[chest ap]
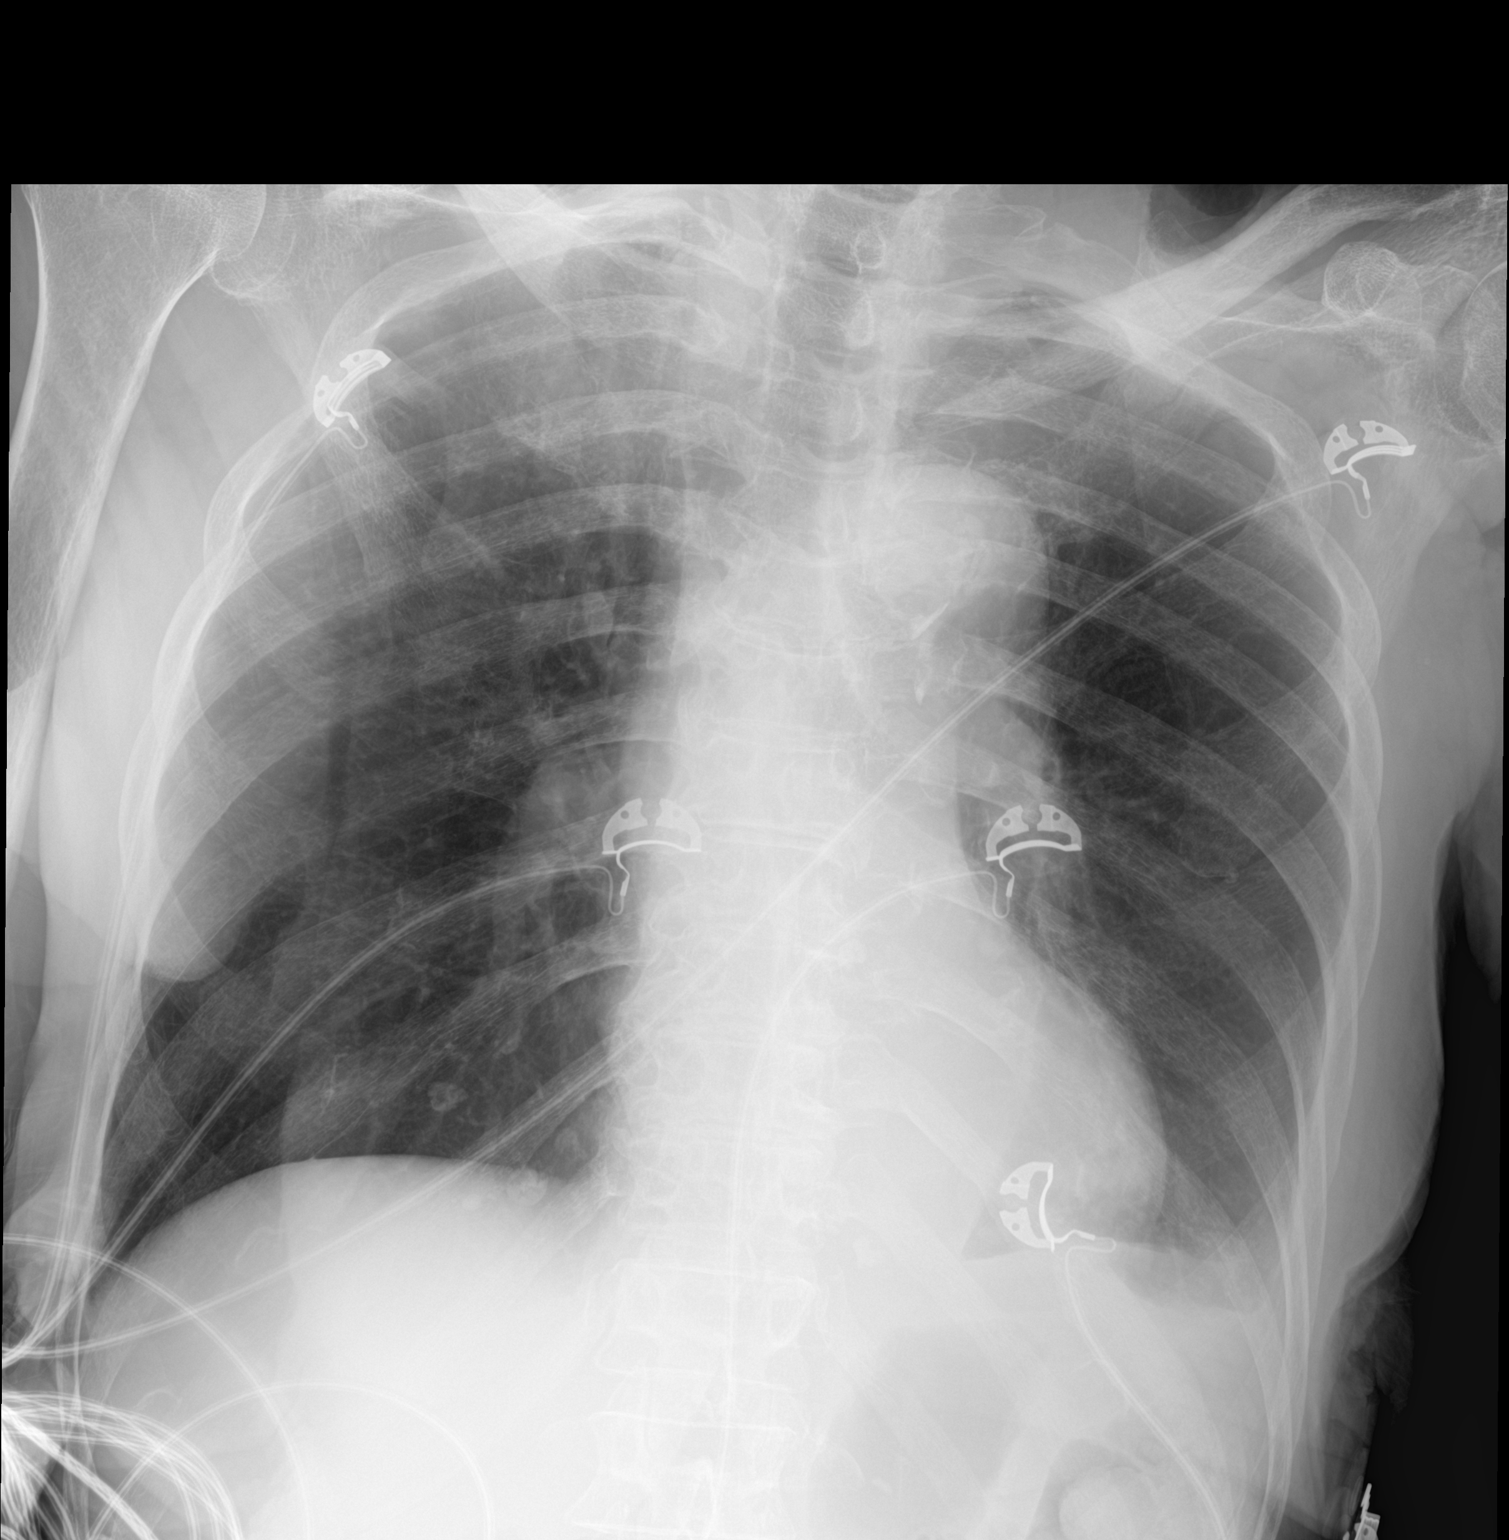

[1 of 1 positions shown; findings below may reference images not displayed]

FINDINGS: The cardiomediastinal silhouette is unremarkable.

Mild left basilar atelectasis with possible trace left pleural
effusion noted.

There is no evidence of pneumothorax, airspace disease, edema or
pulmonary mass.

No acute bony abnormalities are identified.
IMPRESSION: Mild left basilar atelectasis was possible trace left pleural
effusion.

## 2019-02-04 IMAGING — MR MR HEAD W/O CM
9 of 10 series · 40 of 48 positions shown · non-contrast
Comparison: [facility] CT from 07/15/2017.

CLINICAL DATA: Initial evaluation for focal neural deficit, stroke
suspected.

EXAM:
MRI HEAD WITHOUT CONTRAST
TECHNIQUE: Multiplanar, multiecho pulse sequences of the brain and surrounding
structures were obtained without intravenous contrast.

[Series 2: GRE · sagittal · 5.0mm · 0.45mm/px · 3 of 21 slices shown (1 of 2)]
[im 1/21]
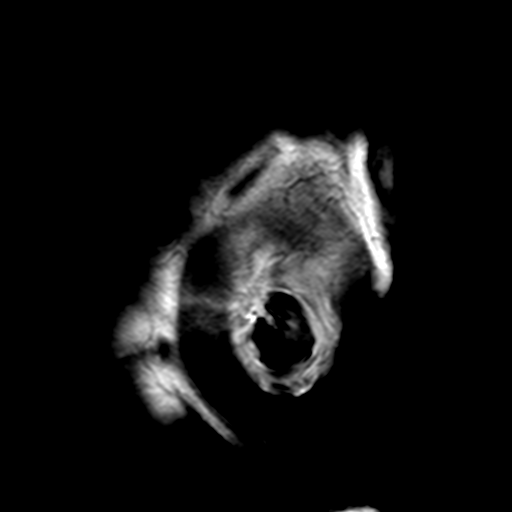
[im 11/21]
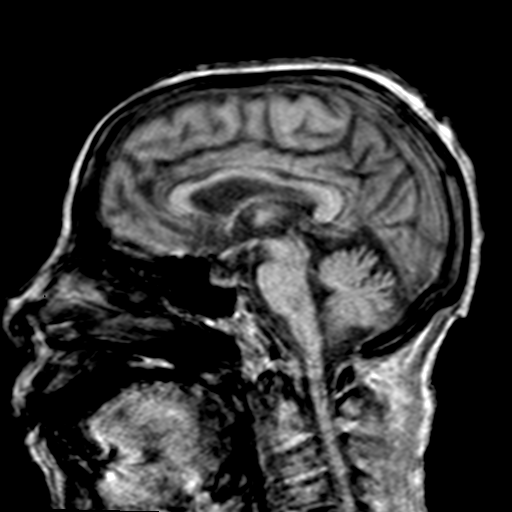
[im 21/21]
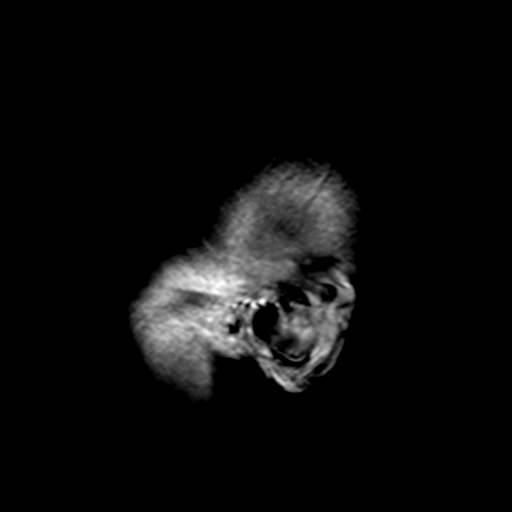

[Series 4: DWI · axial · 3.0mm · 1.80mm/px · z∈[-73,+72]mm · 7 of 50 slices shown (1 of 2)]
[im 1/50]
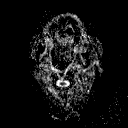
[im 9/50]
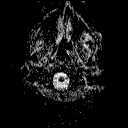
[im 17/50]
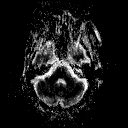
[im 25/50]
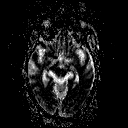
[im 33/50]
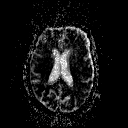
[im 41/50]
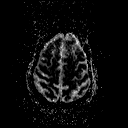
[im 50/50]
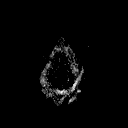

[Series 6: DWI · coronal · 3.0mm · 1.80mm/px · 6 of 44 slices shown (2 of 2)]
[im 1/44]
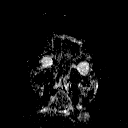
[im 9/44]
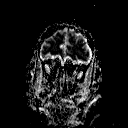
[im 18/44]
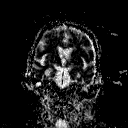
[im 26/44]
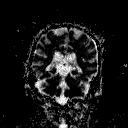
[im 35/44]
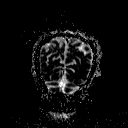
[im 44/44]
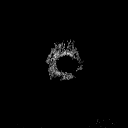

[Series 8: FLAIR · axial · 3.0mm · 0.45mm/px · z∈[-80,+74]mm · 7 of 53 slices shown]
[im 1/53]
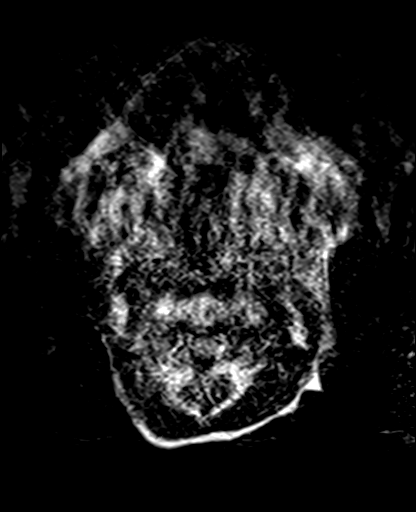
[im 9/53]
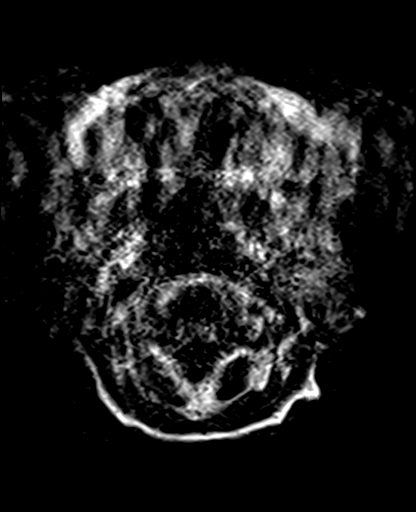
[im 18/53]
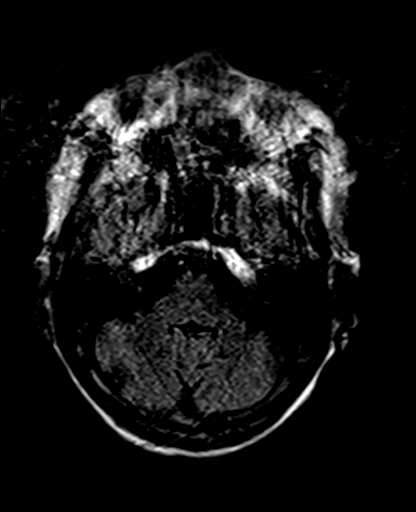
[im 27/53]
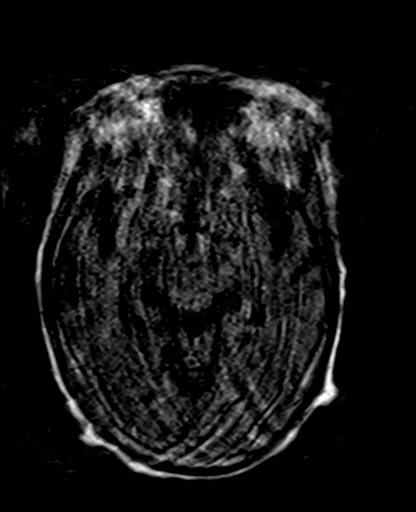
[im 35/53]
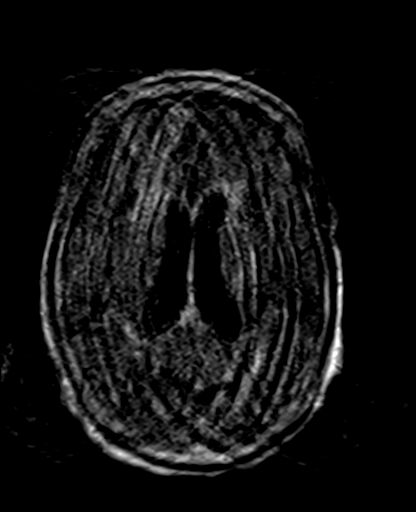
[im 44/53]
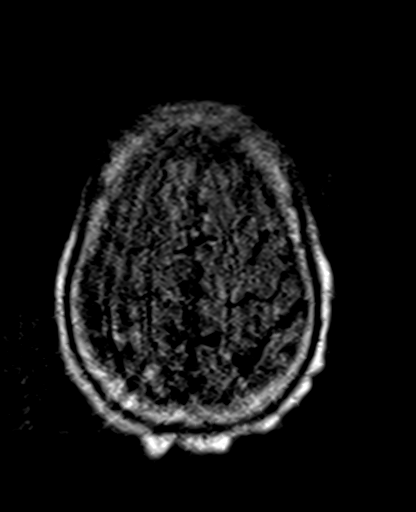
[im 53/53]
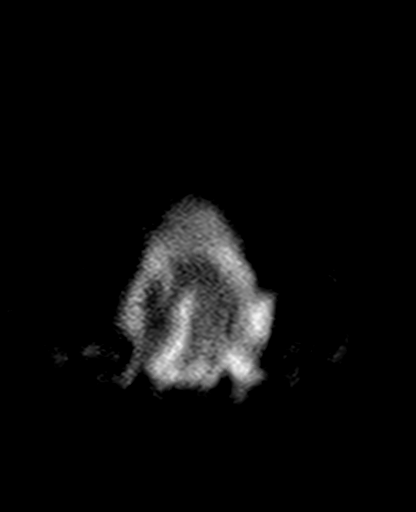

[Series 9: T2 · axial · 5.0mm · 1.20mm/px · z∈[-70,+70]mm · 3 of 23 slices shown (1 of 3)]
[im 1/23]
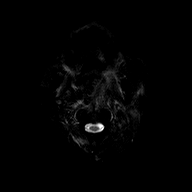
[im 12/23]
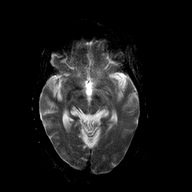
[im 23/23]
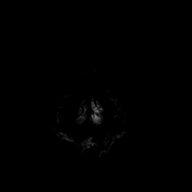

[Series 10: GRE · axial · 5.0mm · 0.45mm/px · z∈[-77,+77]mm · 3 of 25 slices shown (2 of 2)]
[im 1/25]
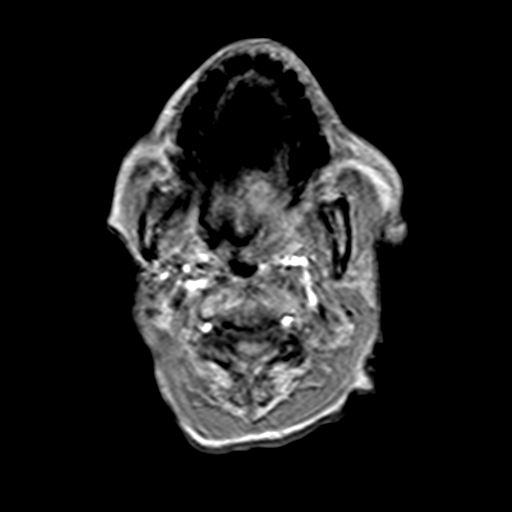
[im 13/25]
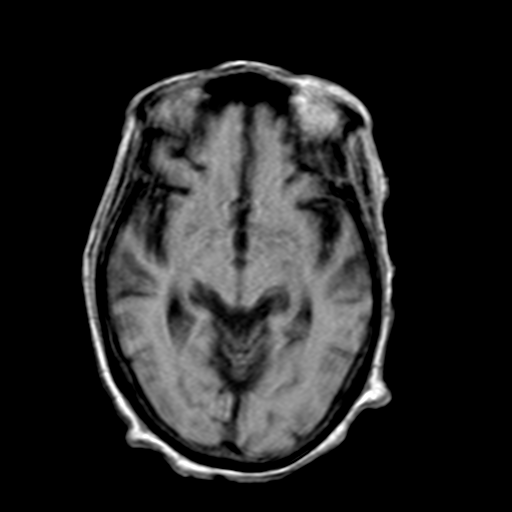
[im 25/25]
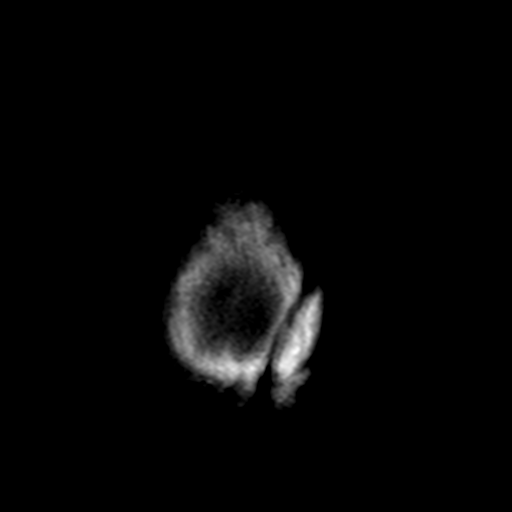

[Series 11: T2 · coronal · 5.0mm · 0.45mm/px · 3 of 26 slices shown (2 of 3)]
[im 1/26]
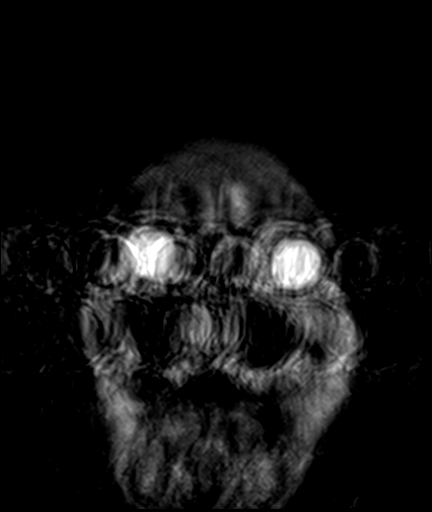
[im 13/26]
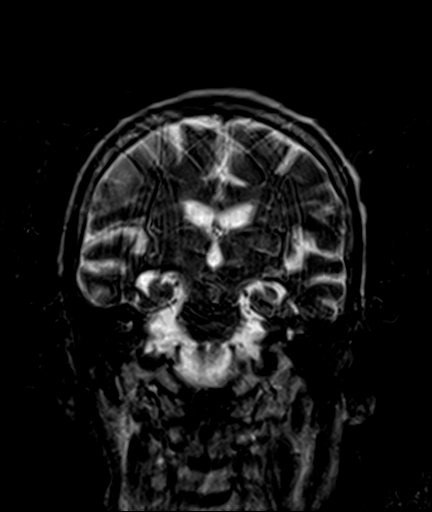
[im 26/26]
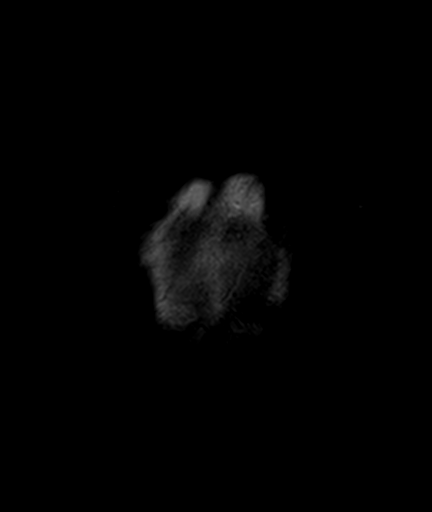

[Series 12: T2 · axial · 5.0mm · 0.45mm/px · z∈[-79,+63]mm · 3 of 23 slices shown (3 of 3)]
[im 1/23]
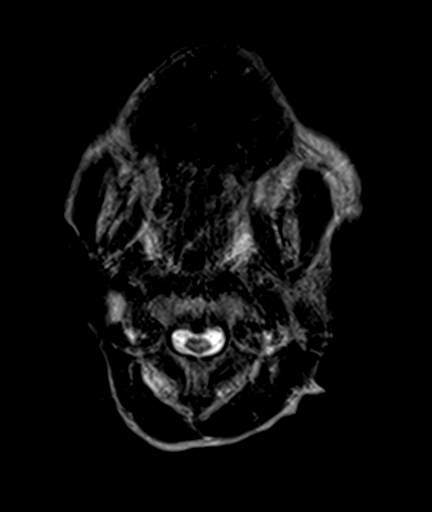
[im 12/23]
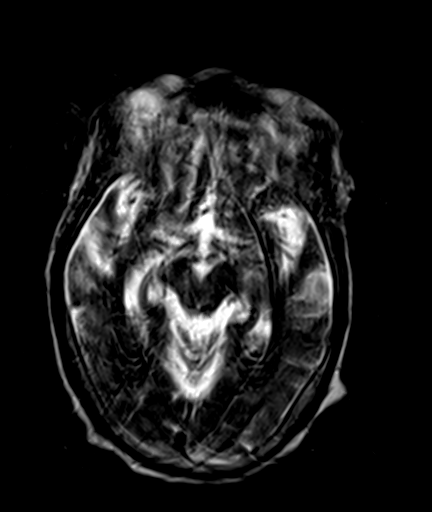
[im 23/23]
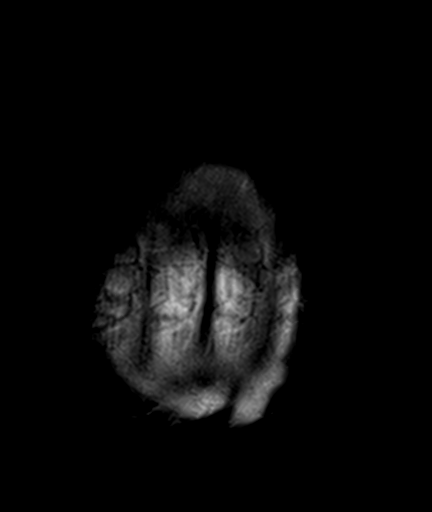

[Series 100: ax (id) · axial · 3.0mm · 1.80mm/px · z∈[-73,+22]mm · 5 of 50 slices shown]
[im 1/50]
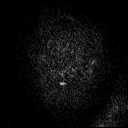
[im 9/50]
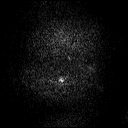
[im 17/50]
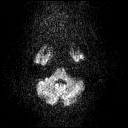
[im 25/50]
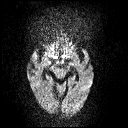
[im 33/50]
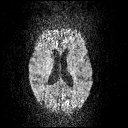

[40 of 48 positions shown; findings below may reference images not displayed]

FINDINGS: Brain: Study severely degraded by motion artifact.

Diffuse prominence of the CSF containing spaces compatible with
generalized age-related cerebral atrophy. T2/FLAIR hyperintensity
within the periventricular white matter most likely related chronic
small vessel ischemic disease, mild for age.

No abnormal foci of restricted diffusion to suggest acute or
subacute ischemia. Gray-white matter differentiation maintained. No
other evidence for chronic infarction. No acute or chronic
intracranial hemorrhage.

No appreciable mass lesion. No midline shift or mass effect. No
extra-axial fluid collection.

Pituitary gland suprasellar region normal. Midline structures intact
and normal.

Vascular: Major intracranial vascular flow voids are grossly
maintained.

Skull and upper cervical spine: Craniocervical junction within
normal limits. Bone marrow signal intensity grossly normal. No scalp
soft tissue abnormality.

Sinuses/Orbits: Globes and orbital soft tissues grossly within
normal limits. Patient status post lens extraction bilaterally.
Paranasal sinuses grossly clear. No appreciable mastoid effusion.

Other: None.
IMPRESSION: 1. Motion degraded exam. No definite acute intracranial infarct or
other abnormality identified.
2. Age-related cerebral atrophy with mild chronic small vessel
ischemic disease.

## 2021-09-14 DEATH — deceased
